# Patient Record
Sex: Female | Born: 1975 | Race: White | Hispanic: No | Marital: Married | Smoking: Never smoker
Health system: Southern US, Community
[De-identification: ages and names within clinical notes are randomized; demographics above are authoritative.]

## PROBLEM LIST (undated history)

## (undated) DIAGNOSIS — M1990 Unspecified osteoarthritis, unspecified site: Secondary | ICD-10-CM

## (undated) DIAGNOSIS — D649 Anemia, unspecified: Secondary | ICD-10-CM

## (undated) DIAGNOSIS — E785 Hyperlipidemia, unspecified: Secondary | ICD-10-CM

## (undated) DIAGNOSIS — K900 Celiac disease: Secondary | ICD-10-CM

## (undated) HISTORY — PX: CHOLECYSTECTOMY: SHX55

## (undated) HISTORY — PX: BREAST ENHANCEMENT SURGERY: SHX7

## (undated) HISTORY — DX: Unspecified osteoarthritis, unspecified site: M19.90

---

## 2006-09-20 HISTORY — PX: REDUCTION MAMMAPLASTY: SUR839

## 2009-06-05 ENCOUNTER — Ambulatory Visit: Payer: Self-pay | Admitting: Unknown Physician Specialty

## 2010-01-17 ENCOUNTER — Emergency Department: Payer: Self-pay | Admitting: Emergency Medicine

## 2011-03-25 ENCOUNTER — Ambulatory Visit: Payer: Self-pay | Admitting: Internal Medicine

## 2011-03-26 ENCOUNTER — Ambulatory Visit: Payer: Self-pay | Admitting: Surgery

## 2011-06-14 ENCOUNTER — Other Ambulatory Visit: Payer: Self-pay | Admitting: Gastroenterology

## 2011-06-21 ENCOUNTER — Ambulatory Visit: Payer: Self-pay | Admitting: Gastroenterology

## 2011-06-24 LAB — PATHOLOGY REPORT

## 2014-01-17 ENCOUNTER — Emergency Department: Payer: Self-pay | Admitting: Emergency Medicine

## 2014-09-20 DIAGNOSIS — K8020 Calculus of gallbladder without cholecystitis without obstruction: Secondary | ICD-10-CM

## 2014-09-20 HISTORY — PX: CHOLECYSTECTOMY: SHX55

## 2014-09-20 HISTORY — DX: Calculus of gallbladder without cholecystitis without obstruction: K80.20

## 2014-10-03 ENCOUNTER — Ambulatory Visit: Payer: Self-pay | Admitting: Obstetrics and Gynecology

## 2014-10-09 ENCOUNTER — Ambulatory Visit: Payer: Self-pay | Admitting: Obstetrics and Gynecology

## 2015-05-09 ENCOUNTER — Other Ambulatory Visit: Payer: Self-pay

## 2015-05-09 ENCOUNTER — Emergency Department: Payer: Managed Care, Other (non HMO)

## 2015-05-09 ENCOUNTER — Emergency Department
Admission: EM | Admit: 2015-05-09 | Discharge: 2015-05-09 | Disposition: A | Discharge status: Home / Self Care | Payer: Managed Care, Other (non HMO) | Attending: Emergency Medicine | Admitting: Emergency Medicine

## 2015-05-09 ENCOUNTER — Encounter: Payer: Self-pay | Admitting: Emergency Medicine

## 2015-05-09 DIAGNOSIS — Z3202 Encounter for pregnancy test, result negative: Secondary | ICD-10-CM | POA: Diagnosis not present

## 2015-05-09 DIAGNOSIS — R079 Chest pain, unspecified: Secondary | ICD-10-CM | POA: Diagnosis not present

## 2015-05-09 DIAGNOSIS — R0602 Shortness of breath: Secondary | ICD-10-CM | POA: Diagnosis present

## 2015-05-09 HISTORY — DX: Hyperlipidemia, unspecified: E78.5

## 2015-05-09 LAB — CBC
HCT: 39.7 % (ref 35.0–47.0)
Hemoglobin: 13.3 g/dL (ref 12.0–16.0)
MCH: 31 pg (ref 26.0–34.0)
MCHC: 33.6 g/dL (ref 32.0–36.0)
MCV: 92.4 fL (ref 80.0–100.0)
Platelets: 272 10*3/uL (ref 150–440)
RBC: 4.3 MIL/uL (ref 3.80–5.20)
RDW: 12.5 % (ref 11.5–14.5)
WBC: 8.8 10*3/uL (ref 3.6–11.0)

## 2015-05-09 LAB — BASIC METABOLIC PANEL
Anion gap: 6 (ref 5–15)
BUN: 13 mg/dL (ref 6–20)
CO2: 25 mmol/L (ref 22–32)
Calcium: 9.5 mg/dL (ref 8.9–10.3)
Chloride: 108 mmol/L (ref 101–111)
Creatinine, Ser: 0.71 mg/dL (ref 0.44–1.00)
GFR calc Af Amer: >60 mL/min (ref >60)
Glucose, Bld: 84 mg/dL (ref 65–99)
Potassium: 4.6 mmol/L (ref 3.5–5.1)
Sodium: 139 mmol/L (ref 135–145)

## 2015-05-09 LAB — TROPONIN I: Troponin I: <0.03 ng/mL (ref <0.031)

## 2015-05-09 LAB — FIBRIN DERIVATIVES D-DIMER (ARMC ONLY): FIBRIN DERIVATIVES D-DIMER (ARMC): 885 — AB (ref 0–499)

## 2015-05-09 LAB — POCT PREGNANCY, URINE: Preg Test, Ur: NEGATIVE

## 2015-05-09 MED ORDER — IOHEXOL 350 MG/ML SOLN
100.0000 mL | Freq: Once | INTRAVENOUS | Status: AC | PRN
  Administered 2015-05-09: 100 mL via INTRAVENOUS

## 2015-05-09 NOTE — ED Provider Notes (Signed)
I seemed care at shift change 3 PM. Patient's troponin repeat was negative. Her d-dimer was elevated. A chest CT to rule out PE was negative for PE or other cause of her shortness of breath and left arm discomfort. I did discuss with her the pulmonary nodule and it seems as she has no risk factors. She will follow up with her primary care physician Dr. Hyacinth Meeker to discuss whether or not she needs repeat CT for the pulmonary nodule.  Governor Rooks, MD 05/09/15 [phone]

## 2015-05-09 NOTE — ED Notes (Signed)
Pt presents with some back pain, arm pain and shortness of breath for one week. Pt is under a lot of stress right now.no signs of acute distress.

## 2015-05-09 NOTE — ED Provider Notes (Signed)
[facility] Emergency Department Provider Note  ____________________________________________  Time seen: Approximately 2:40 PM  I have reviewed the triage vital signs and the nursing notes.   HISTORY  Chief Complaint Shortness of Breath    HPI Amber Mann is a 39 y.o. female no  past medical history other than hyperlipidemia.She reports that she has been feeling under stress after sending her child to school in [place] last week. For about a week she has experienced achiness that is often in her left arm, sometimes goes across her chest and feels tight, and this is also associated with a slight feeling of shortness of breath.  No abdominal pain. Slight nauseated feeling yesterday. No vomiting. No fevers or chills. No cough. The present time she reports that she feels well, but last experienced a feeling of tightness in the chest while on the waiting room.  No ripping, tearing, or moving pain.  Pain described as achy, across the chest. No heavy chest pressure.  Past Medical History  Diagnosis Date  . Hyperlipidemia     There are no active problems to display for this patient.  no family history of early onset coronary disease.  Past Surgical History  Procedure Laterality Date  . Cholecystectomy      Current Outpatient Rx  Name  Route  Sig  Dispense  Refill  . hydrochlorothiazide (HYDRODIURIL) 25 MG tablet   Oral   Take 25 mg by mouth daily as needed (for edema).         Suzzanne Cloud Estradiol (SPRINTEC 28 PO)   Oral   Take 1 tablet by mouth at bedtime.           Allergies Flagyl  No family history on file.  Social History Social History  Substance Use Topics  . Smoking status: Never Smoker   . Smokeless tobacco: None  . Alcohol Use: Yes    Review of Systems Constitutional: No fever/chills Eyes: No visual changes. ENT: No sore throat. Cardiovascular: See history of present illness  Respiratory: See history of  present illness  Gastrointestinal: No abdominal pain.  no vomiting.  No diarrhea.  No constipation. Genitourinary: Negative for dysuria. Musculoskeletal: Negative for back pain. Skin: Negative for rash. Neurological: Negative for headaches, focal weakness or numbness.  She denies pregnancy. Is on birth control as well.  No history of blood clots. Father does have protein C deficiency, but patient reports she was tested and negative. No recent long trips or travel. No recent surgery. No leg pain or swelling. Nonsmoker. Does take oral estrogens.  10-point ROS otherwise negative.  ____________________________________________   PHYSICAL EXAM:  VITAL SIGNS: ED Triage Vitals  Enc Vitals Group     BP 05/09/15 1026 129/72 mmHg     Pulse Rate 05/09/15 1026 87     Resp 05/09/15 1026 18     Temp 05/09/15 1026 98.2 F (36.8 C)     Temp Source 05/09/15 1026 Oral     SpO2 05/09/15 1026 98 %     Weight 05/09/15 1026 178 lb (80.74 kg)     Height 05/09/15 1026 5\' 9"  (1.753 m)     Head Cir --      Peak Flow --      Pain Score 05/09/15 1027 3     Pain Loc --      Pain Edu? --      Excl. in GC? --     Constitutional: Alert and oriented. Well appearing and in no acute  distress. Eyes: Conjunctivae are normal. PERRL. EOMI. Head: Atraumatic. Nose: No congestion/rhinnorhea. Mouth/Throat: Mucous membranes are moist.  Oropharynx non-erythematous. Neck: No stridor.   Cardiovascular: Normal rate, regular rhythm. Grossly normal heart sounds.  Good peripheral circulation. Respiratory: Normal respiratory effort.  No retractions. Lungs CTAB. Gastrointestinal: Soft and nontender. No distention. No abdominal bruits. No CVA tenderness. Musculoskeletal: No lower extremity tenderness nor edema.  No joint effusions. No venous cords. No thigh tenderness. No evidence DVT. Normal dorsalis pedis pulses bilaterally. Neurologic:  Normal speech and language. No gross focal neurologic deficits are appreciated.  Skin:  Skin is warm, dry and intact. No rash noted. Psychiatric: Mood and affect are normal. Speech and behavior are normal.  Patient has a very normal and reassuring physical exam.  ____________________________________________   LABS (all labs ordered are listed, but only abnormal results are displayed)  Labs Reviewed  FIBRIN DERIVATIVES D-DIMER ([facility] ONLY) - Abnormal; Notable for the following:    Fibrin derivatives D-dimer ([facility]) 885 (*)    All other components within normal limits  BASIC METABOLIC PANEL  CBC  TROPONIN I  TROPONIN I  POC URINE PREG, ED   ____________________________________________  EKG  ED ECG REPORT I, QUALE, MARK, the attending physician, personally viewed and interpreted this ECG.  Date: 05/09/2015 EKG Time: 1040 Rate: 75 Rhythm: normal sinus rhythm QRS Axis: normal Intervals: normal ST/T Wave abnormalities: normal Conduction Disutrbances: none Narrative Interpretation: unremarkable  ____________________________________________  RADIOLOGY  Pending ____________________________________________   PROCEDURES  Procedure(s) performed: None  Critical Care performed: No  ____________________________________________   INITIAL IMPRESSION / ASSESSMENT AND PLAN / ED COURSE  Pertinent labs & imaging results that were available during my care of the patient were reviewed by me and considered in my medical decision making (see chart for details).  Patient presents with one week of intermittent chest tightness. Patient really has no risk factor for coronary disease except for history of hyperlipidemia. Her symptoms are atypical and that they've been ongoing for a week, they are nonexertional, no associated diaphoresis or severe chest pressure. Her EKG is completely normal.  First troponin is normal. Because of her history of being on estrogens and, we will screen her for pulmonary embolism via low risk well score criteria d-dimer. No evidence DVT on  exam. Patient has been tested for protein C deficiency and does not have this condition.  Patient's d-dimer returned positive. Order CT angiography r/o PE pending a negative urine pregnancy test. Dr. Shaune Pollack updated.  Diagnosis suspect her chest pain is due to stress, but did discuss with the patient close follow-up care. I reviewed her EKG and labs and clinical history with Dr. Cassie Freer of cardiology. We will have her follow-up on Monday with [facility] clinic. Patient is agreeable with this.  Ongoing care and disposition assigned to Governor Rooks, second troponin and d-dimer pending. If negative, plan is to discharge the patient to follow-up with Dr. Cassie Freer Monday.  Careful chest pain or trouble precautions advised the patient and her mother, both of whom are amicable and agreeable with plan.   ____________________________________________   FINAL CLINICAL IMPRESSION(S) / ED DIAGNOSES  Final diagnoses:  Chest pain with low risk for cardiac etiology  Chest pain      Sharyn Creamer, MD 05/09/15 1600

## 2015-05-09 NOTE — Discharge Instructions (Signed)
You have been seen in the Emergency Department (ED) today for chest pain.  As we have discussed today’s test results are normal, but you may require further testing. ° °Please follow up with the recommended doctor as instructed above in these documents regarding today’s emergent visit and your recent symptoms to discuss further management.  Continue to take your regular medications. If you are not doing so already, please also take a daily baby aspirin (81 mg), at least until you follow up with your doctor. ° °Return to the Emergency Department (ED) if you experience any further chest pain/pressure/tightness, difficulty breathing, or sudden sweating, or other symptoms that concern you. ° ° °Chest Pain Observation °It is often hard to give a specific diagnosis for the cause of chest pain. Among other possibilities your symptoms might be caused by inadequate oxygen delivery to your heart (angina). Angina that is not treated or evaluated can lead to a heart attack (myocardial infarction) or death. °Blood tests, electrocardiograms, and X-rays may have been done to help determine a possible cause of your chest pain. After evaluation and observation, your health care provider has determined that it is unlikely your pain was caused by an unstable condition that requires hospitalization. However, a full evaluation of your pain may need to be completed, with additional diagnostic testing as directed. It is very important to keep your follow-up appointments. Not keeping your follow-up appointments could result in permanent heart damage, disability, or death. If there is any problem keeping your follow-up appointments, you must call your health care provider. °HOME CARE INSTRUCTIONS  °Due to the slight chance that your pain could be angina, it is important to follow your health care provider's treatment plan and also maintain a healthy lifestyle: °· Maintain or work toward achieving a healthy weight. °· Stay physically active  and exercise regularly. °· Decrease your salt intake. °· Eat a balanced, healthy diet. Talk to a dietitian to learn about heart-healthy foods. °· Increase your fiber intake by including whole grains, vegetables, fruits, and nuts in your diet. °· Avoid situations that cause stress, anger, or depression. °· Take medicines as advised by your health care provider. Report any side effects to your health care provider. Do not stop medicines or adjust the dosages on your own. °· Quit smoking. Do not use nicotine patches or gum until you check with your health care provider. °· Keep your blood pressure, blood sugar, and cholesterol levels within normal limits. °· Limit alcohol intake to no more than 1 drink per day for women who are not pregnant and 2 drinks per day for men. °· Do not abuse drugs. °SEEK IMMEDIATE MEDICAL CARE IF: °You have severe chest pain or pressure which may include symptoms such as: °· You feel pain or pressure in your arms, neck, jaw, or back. °· You have severe back or abdominal pain, feel sick to your stomach (nauseous), or throw up (vomit). °· You are sweating profusely. °· You are having a fast or irregular heartbeat. °· You feel short of breath while at rest. °· You notice increasing shortness of breath during rest, sleep, or with activity. °· You have chest pain that does not get better after rest or after taking your usual medicine. °· You wake from sleep with chest pain. °· You are unable to sleep because you cannot breathe. °· You develop a frequent cough or you are coughing up blood. °· You feel dizzy, faint, or experience extreme fatigue. °· You develop severe weakness, dizziness, fainting,   or chills. °Any of these symptoms may represent a serious problem that is an emergency. Do not wait to see if the symptoms will go away. Call your local emergency services (911 in the [place] Do not drive yourself to the hospital. °MAKE SURE YOU: °· Understand these instructions. °· Will watch your  condition. °· Will get help right away if you are not doing well or get worse. °Document Released: 10/09/2010 Document Revised: 09/11/2013 Document Reviewed: 03/08/2013 °ExitCare® Patient Information ©2015 [employer]. This information is not intended to replace advice given to you by your health care provider. Make sure you discuss any questions you have with your health care provider. ° °

## 2015-09-21 HISTORY — PX: REDUCTION MAMMAPLASTY: SUR839

## 2015-10-07 ENCOUNTER — Other Ambulatory Visit: Payer: Self-pay | Admitting: Obstetrics and Gynecology

## 2015-10-07 DIAGNOSIS — N6314 Unspecified lump in the right breast, lower inner quadrant: Secondary | ICD-10-CM

## 2015-10-10 ENCOUNTER — Encounter: Payer: Self-pay | Admitting: *Deleted

## 2015-10-13 ENCOUNTER — Encounter
Admission: RE | Disposition: A | Discharge status: Home / Self Care | Payer: Self-pay | Source: Ambulatory Visit | Attending: Gastroenterology

## 2015-10-13 ENCOUNTER — Encounter: Payer: Self-pay | Admitting: *Deleted

## 2015-10-13 ENCOUNTER — Ambulatory Visit: Payer: Managed Care, Other (non HMO) | Admitting: Anesthesiology

## 2015-10-13 ENCOUNTER — Ambulatory Visit
Admission: RE | Admit: 2015-10-13 | Discharge: 2015-10-13 | Disposition: A | Discharge status: Home / Self Care | Payer: Managed Care, Other (non HMO) | Source: Ambulatory Visit | Attending: Gastroenterology | Admitting: Gastroenterology

## 2015-10-13 DIAGNOSIS — R197 Diarrhea, unspecified: Secondary | ICD-10-CM | POA: Diagnosis present

## 2015-10-13 DIAGNOSIS — Z79899 Other long term (current) drug therapy: Secondary | ICD-10-CM | POA: Insufficient documentation

## 2015-10-13 DIAGNOSIS — K5730 Diverticulosis of large intestine without perforation or abscess without bleeding: Secondary | ICD-10-CM | POA: Insufficient documentation

## 2015-10-13 DIAGNOSIS — E785 Hyperlipidemia, unspecified: Secondary | ICD-10-CM | POA: Diagnosis not present

## 2015-10-13 DIAGNOSIS — Z888 Allergy status to other drugs, medicaments and biological substances status: Secondary | ICD-10-CM | POA: Insufficient documentation

## 2015-10-13 HISTORY — DX: Anemia, unspecified: D64.9

## 2015-10-13 HISTORY — PX: COLONOSCOPY WITH PROPOFOL: SHX5780

## 2015-10-13 LAB — POCT PREGNANCY, URINE: PREG TEST UR: NEGATIVE

## 2015-10-13 SURGERY — COLONOSCOPY WITH PROPOFOL
Anesthesia: General

## 2015-10-13 MED ORDER — SODIUM CHLORIDE 0.9 % IV SOLN
INTRAVENOUS | Status: DC
  Administered 2015-10-13: 11:00:00 via INTRAVENOUS

## 2015-10-13 MED ORDER — PROPOFOL 10 MG/ML IV BOLUS
INTRAVENOUS | Status: DC | PRN
  Administered 2015-10-13: 30 mg via INTRAVENOUS
  Administered 2015-10-13: 40 mg via INTRAVENOUS

## 2015-10-13 MED ORDER — FENTANYL CITRATE (PF) 100 MCG/2ML IJ SOLN
INTRAMUSCULAR | Status: DC | PRN
  Administered 2015-10-13: 50 ug via INTRAVENOUS

## 2015-10-13 MED ORDER — ONDANSETRON HCL 4 MG/2ML IJ SOLN: 4.0000 mg | Freq: Once | INTRAMUSCULAR | Status: DC | PRN

## 2015-10-13 MED ORDER — PHENYLEPHRINE HCL 10 MG/ML IJ SOLN
INTRAMUSCULAR | Status: DC | PRN
  Administered 2015-10-13: 100 ug via INTRAVENOUS

## 2015-10-13 MED ORDER — PROPOFOL 500 MG/50ML IV EMUL
INTRAVENOUS | Status: DC | PRN
  Administered 2015-10-13: 180 ug/kg/min via INTRAVENOUS

## 2015-10-13 MED ORDER — MIDAZOLAM HCL 5 MG/5ML IJ SOLN
INTRAMUSCULAR | Status: DC | PRN
  Administered 2015-10-13: 2 mg via INTRAVENOUS

## 2015-10-13 MED ORDER — FENTANYL CITRATE (PF) 100 MCG/2ML IJ SOLN: 25.0000 ug | INTRAMUSCULAR | Status: DC | PRN

## 2015-10-13 NOTE — Anesthesia Postprocedure Evaluation (Signed)
Anesthesia Post Note  Patient: Amber Mann  Procedure(s) Performed: Procedure(s) (LRB): COLONOSCOPY WITH PROPOFOL (N/A)  Patient location during evaluation: PACU Anesthesia Type: General Level of consciousness: awake and alert and oriented Pain management: pain level controlled Vital Signs Assessment: post-procedure vital signs reviewed and stable Respiratory status: spontaneous breathing Cardiovascular status: blood pressure returned to baseline Anesthetic complications: no    Last Vitals:  Filed Vitals:   10/13/15 1139 10/13/15 1149  BP: 106/74 107/69  Pulse: 73 66  Temp:    Resp: 17 17    Last Pain: There were no vitals filed for this visit.               Kristinia Leavy

## 2015-10-13 NOTE — Anesthesia Preprocedure Evaluation (Signed)
Anesthesia Evaluation  Patient identified by MRN, date of birth, ID band Patient awake    Reviewed: Allergy & Precautions, NPO status , Patient's Chart, lab work & pertinent test results  Airway Mallampati: II  TM Distance: >3 FB     Dental no notable dental hx.    Pulmonary neg pulmonary ROS,    Pulmonary exam normal        Cardiovascular Normal cardiovascular exam     Neuro/Psych negative neurological ROS  negative psych ROS   GI/Hepatic negative GI ROS, Neg liver ROS,   Endo/Other  negative endocrine ROS  Renal/GU negative Renal ROS     Musculoskeletal negative musculoskeletal ROS (+)   Abdominal Normal abdominal exam  (+)   Peds  Hematology negative hematology ROS (+) anemia ,   Anesthesia Other Findings Increased lipids  Reproductive/Obstetrics negative OB ROS                             Anesthesia Physical Anesthesia Plan  ASA: II  Anesthesia Plan: General   Post-op Pain Management:    Induction: Intravenous  Airway Management Planned: Nasal Cannula  Additional Equipment:   Intra-op Plan:   Post-operative Plan:   Informed Consent: I have reviewed the patients History and Physical, chart, labs and discussed the procedure including the risks, benefits and alternatives for the proposed anesthesia with the patient or authorized representative who has indicated his/her understanding and acceptance.   Dental advisory given  Plan Discussed with: CRNA and Surgeon  Anesthesia Plan Comments:         Anesthesia Quick Evaluation

## 2015-10-13 NOTE — H&P (Signed)
Outpatient short stay form Pre-procedure 10/13/2015 10:16 AM Amber Deem MD  Primary Physician: Dr. Bethann Punches  Reason for visit:  Diarrhea  History of present illness:  Patient is a 40 year old female presenting with a problem of some change of bowel habits with diarrhea. She states that she was under a fair amount of stress related to her job once that he discontinued in mid December this seemed to improve. She had stool cultures with ova and parasites were negative C. difficile negative, TSH was normal.    Current facility-administered medications:  .  0.9 %  sodium chloride infusion, , Intravenous, Continuous, Amber Deem, MD .  0.9 %  sodium chloride infusion, , Intravenous, Continuous, Amber Deem, MD  Prescriptions prior to admission  Medication Sig Dispense Refill Last Dose  . colestipol (COLESTID) 1 g tablet Take 2 g by mouth 2 (two) times daily.   10/12/2015 at Unknown time  . ergocalciferol (VITAMIN D2) 50000 units capsule Take 50,000 Units by mouth once a week.   10/12/2015 at Unknown time  . escitalopram (LEXAPRO) 10 MG tablet Take 10 mg by mouth daily.   10/12/2015 at Unknown time  . hydrochlorothiazide (HYDRODIURIL) 25 MG tablet Take 25 mg by mouth daily as needed (for edema).   10/12/2015 at Unknown time  . hyoscyamine (LEVSIN SL) 0.125 MG SL tablet Place 0.125 mg under the tongue every 6 (six) hours as needed.   10/12/2015 at Unknown time  . Norgestimate-Eth Estradiol (SPRINTEC 28 PO) Take 1 tablet by mouth at bedtime.   10/12/2015 at Unknown time     Allergies  Allergen Reactions  . Flagyl [Metronidazole] Other (See Comments)    Reaction:  Nerve pain      Past Medical History  Diagnosis Date  . Hyperlipidemia   . Anemia     Review of systems:      Physical Exam    Heart and lungs: Regular rate and rhythm without rub or gallop, lungs are bilaterally clear.    HEENT: Normocephalic atraumatic eyes are anicteric    Other:     Pertinant exam  for procedure: Soft nontender nondistended bowel sounds positive normoactive    Planned proceedures: Colonoscopy and indicated procedures. I have discussed the risks benefits and complications of procedures to include not limited to bleeding, infection, perforation and the risk of sedation and the patient wishes to proceed.    Amber Deem, MD Gastroenterology 10/13/2015  10:16 AM

## 2015-10-13 NOTE — Transfer of Care (Signed)
Immediate Anesthesia Transfer of Care Note  Patient: Amber Mann  Procedure(s) Performed: Procedure(s): COLONOSCOPY WITH PROPOFOL (N/A)  Patient Location: PACU  Anesthesia Type:General  Level of Consciousness: awake  Airway & Oxygen Therapy: Patient Spontanous Breathing and Patient connected to nasal cannula oxygen  Post-op Assessment: Report given to RN and Post -op Vital signs reviewed and stable  Post vital signs: Reviewed and stable  Last Vitals:  Filed Vitals:   10/13/15 1012 10/13/15 1119  BP: 116/75   Pulse: 87 80  Temp: 37.4 C 37.3 C  Resp: 18 18    Complications: No apparent anesthesia complications

## 2015-10-13 NOTE — Op Note (Signed)
[facility] Gastroenterology Patient Name: Amber Mann Procedure Date: 10/13/2015 10:37 AM MRN: [id number] Account #: [id number] Date of Birth: 1976-05-31 Admit Type: Outpatient Age: 40 Room: [facility] ENDO ROOM 3 Gender: Female Note Status: Finalized Procedure:         Colonoscopy Indications:       Change in bowel habits, Clinically significant diarrhea of                     unexplained origin Providers:         Christena Deem, MD Referring MD:      Danella Penton, MD (Referring MD) Medicines:         Monitored Anesthesia Care Complications:     No immediate complications. Procedure:         Pre-Anesthesia Assessment:                    - ASA Grade Assessment: II - A patient with mild systemic                     disease.                    After obtaining informed consent, the colonoscope was                     passed under direct vision. Throughout the procedure, the                     patient's blood pressure, pulse, and oxygen saturations                     were monitored continuously. The Colonoscope was                     introduced through the anus and advanced to the the cecum,                     identified by appendiceal orifice and ileocecal valve. The                     quality of the bowel preparation was good. Findings:      Multiple small-mouthed diverticula were found in the sigmoid colon and       in the distal descending colon.      The retroflexed view of the distal rectum and anal verge was normal and       showed no anal or rectal abnormalities.      The exam was otherwise without abnormality. The ileocecal valve was       turned open and noted to be normal.      Biopsies for histology were taken with a cold forceps from the right       colon and left colon for evaluation of microscopic colitis.      The digital rectal exam was normal. Impression:        - Diverticulosis in the sigmoid colon and in the distal   descending colon.                    - The distal rectum and anal verge are normal on                     retroflexion view.                    -  The examination was otherwise normal.                    - Biopsies were taken with a cold forceps from the right                     colon and left colon for evaluation of microscopic colitis. Recommendation:    - Discharge patient to home.                    - Return to GI clinic in 5 weeks. Procedure Code(s): --- Professional ---                    [phone], Colonoscopy, flexible; with biopsy, single or                     multiple Diagnosis Code(s): --- Professional ---                    R19.4, Change in bowel habit                    R19.7, Diarrhea, unspecified                    K57.30, Diverticulosis of large intestine without                     perforation or abscess without bleeding CPT copyright 2014 American Medical Association. All rights reserved. The codes documented in this report are preliminary and upon coder review may  be revised to meet current compliance requirements. Christena Deem, MD 10/13/2015 11:11:09 AM This report has been signed electronically. Number of Addenda: 0 Note Initiated On: 10/13/2015 10:37 AM Scope Withdrawal Time: 0 hours 11 minutes 43 seconds  Total Procedure Duration: 0 hours 19 minutes 32 seconds       [facility]

## 2015-10-14 LAB — SURGICAL PATHOLOGY

## 2015-10-15 ENCOUNTER — Ambulatory Visit
Admission: RE | Admit: 2015-10-15 | Discharge: 2015-10-15 | Disposition: A | Discharge status: Home / Self Care | Payer: Managed Care, Other (non HMO) | Source: Ambulatory Visit | Attending: Obstetrics and Gynecology | Admitting: Obstetrics and Gynecology

## 2015-10-15 ENCOUNTER — Other Ambulatory Visit: Payer: Self-pay | Admitting: Obstetrics and Gynecology

## 2015-10-15 ENCOUNTER — Encounter: Payer: Self-pay | Admitting: Gastroenterology

## 2015-10-15 DIAGNOSIS — N6314 Unspecified lump in the right breast, lower inner quadrant: Secondary | ICD-10-CM

## 2015-10-15 DIAGNOSIS — N63 Unspecified lump in breast: Secondary | ICD-10-CM | POA: Diagnosis present

## 2015-10-15 DIAGNOSIS — R928 Other abnormal and inconclusive findings on diagnostic imaging of breast: Secondary | ICD-10-CM | POA: Insufficient documentation

## 2015-11-26 ENCOUNTER — Other Ambulatory Visit: Payer: Self-pay | Admitting: Internal Medicine

## 2015-11-26 DIAGNOSIS — R918 Other nonspecific abnormal finding of lung field: Secondary | ICD-10-CM

## 2016-04-26 ENCOUNTER — Ambulatory Visit: Payer: Managed Care, Other (non HMO)

## 2016-10-20 ENCOUNTER — Other Ambulatory Visit (INDEPENDENT_AMBULATORY_CARE_PROVIDER_SITE_OTHER): Payer: Self-pay | Admitting: Internal Medicine

## 2016-10-20 DIAGNOSIS — M7989 Other specified soft tissue disorders: Secondary | ICD-10-CM

## 2016-10-21 ENCOUNTER — Ambulatory Visit (INDEPENDENT_AMBULATORY_CARE_PROVIDER_SITE_OTHER): Payer: BLUE CROSS/BLUE SHIELD

## 2016-10-21 DIAGNOSIS — M7989 Other specified soft tissue disorders: Secondary | ICD-10-CM

## 2017-05-12 ENCOUNTER — Other Ambulatory Visit: Payer: Self-pay | Admitting: Obstetrics and Gynecology

## 2017-05-12 DIAGNOSIS — Z1231 Encounter for screening mammogram for malignant neoplasm of breast: Secondary | ICD-10-CM

## 2017-05-25 ENCOUNTER — Ambulatory Visit
Admission: RE | Admit: 2017-05-25 | Discharge: 2017-05-25 | Disposition: A | Discharge status: Home / Self Care | Payer: BLUE CROSS/BLUE SHIELD | Source: Ambulatory Visit | Attending: Obstetrics and Gynecology | Admitting: Obstetrics and Gynecology

## 2017-05-25 ENCOUNTER — Encounter (INDEPENDENT_AMBULATORY_CARE_PROVIDER_SITE_OTHER): Payer: Self-pay

## 2017-05-25 DIAGNOSIS — Z1231 Encounter for screening mammogram for malignant neoplasm of breast: Secondary | ICD-10-CM | POA: Diagnosis not present

## 2017-06-20 HISTORY — PX: AUGMENTATION MAMMAPLASTY: SUR837

## 2018-05-16 ENCOUNTER — Other Ambulatory Visit: Payer: Self-pay | Admitting: Obstetrics and Gynecology

## 2018-05-16 DIAGNOSIS — Z1231 Encounter for screening mammogram for malignant neoplasm of breast: Secondary | ICD-10-CM

## 2018-06-15 ENCOUNTER — Ambulatory Visit
Admission: RE | Admit: 2018-06-15 | Discharge: 2018-06-15 | Disposition: A | Discharge status: Home / Self Care | Payer: BLUE CROSS/BLUE SHIELD | Source: Ambulatory Visit | Attending: Obstetrics and Gynecology | Admitting: Obstetrics and Gynecology

## 2018-06-15 ENCOUNTER — Other Ambulatory Visit: Payer: Self-pay | Admitting: Obstetrics and Gynecology

## 2018-06-15 DIAGNOSIS — Z1231 Encounter for screening mammogram for malignant neoplasm of breast: Secondary | ICD-10-CM | POA: Diagnosis not present

## 2019-04-12 ENCOUNTER — Emergency Department
Admission: EM | Admit: 2019-04-12 | Discharge: 2019-04-12 | Discharge status: Absent without leave | Payer: BLUE CROSS/BLUE SHIELD

## 2020-07-15 ENCOUNTER — Other Ambulatory Visit: Payer: Self-pay | Admitting: Obstetrics and Gynecology

## 2020-07-15 DIAGNOSIS — Z1231 Encounter for screening mammogram for malignant neoplasm of breast: Secondary | ICD-10-CM

## 2020-08-07 ENCOUNTER — Encounter (INDEPENDENT_AMBULATORY_CARE_PROVIDER_SITE_OTHER): Payer: Self-pay

## 2020-08-07 ENCOUNTER — Ambulatory Visit
Admission: RE | Admit: 2020-08-07 | Discharge: 2020-08-07 | Disposition: A | Discharge status: Home / Self Care | Payer: 59 | Source: Ambulatory Visit | Attending: Obstetrics and Gynecology | Admitting: Obstetrics and Gynecology

## 2020-08-07 ENCOUNTER — Other Ambulatory Visit: Payer: Self-pay

## 2020-08-07 DIAGNOSIS — Z1231 Encounter for screening mammogram for malignant neoplasm of breast: Secondary | ICD-10-CM | POA: Insufficient documentation

## 2020-09-20 DIAGNOSIS — K900 Celiac disease: Secondary | ICD-10-CM

## 2020-09-20 HISTORY — DX: Celiac disease: K90.0

## 2021-09-20 HISTORY — PX: BREAST IMPLANT REMOVAL: SUR1101

## 2021-10-14 ENCOUNTER — Other Ambulatory Visit: Payer: Self-pay | Admitting: Obstetrics and Gynecology

## 2021-10-14 DIAGNOSIS — Z1231 Encounter for screening mammogram for malignant neoplasm of breast: Secondary | ICD-10-CM

## 2021-11-05 ENCOUNTER — Other Ambulatory Visit (HOSPITAL_BASED_OUTPATIENT_CLINIC_OR_DEPARTMENT_OTHER): Payer: Self-pay

## 2021-11-09 ENCOUNTER — Other Ambulatory Visit (HOSPITAL_BASED_OUTPATIENT_CLINIC_OR_DEPARTMENT_OTHER): Payer: Self-pay

## 2021-11-09 MED ORDER — MOUNJARO 10 MG/0.5ML ~~LOC~~ SOAJ
SUBCUTANEOUS | 1 refills | Status: DC
  Filled 2021-11-09: qty 2, 28d supply, fill #0
  Filled 2022-09-27: qty 2, 28d supply, fill #1

## 2021-11-19 ENCOUNTER — Other Ambulatory Visit: Payer: Self-pay

## 2021-11-19 ENCOUNTER — Ambulatory Visit
Admission: RE | Admit: 2021-11-19 | Discharge: 2021-11-19 | Disposition: A | Discharge status: Home / Self Care | Payer: 59 | Source: Ambulatory Visit | Attending: Obstetrics and Gynecology | Admitting: Obstetrics and Gynecology

## 2021-11-19 DIAGNOSIS — Z1231 Encounter for screening mammogram for malignant neoplasm of breast: Secondary | ICD-10-CM | POA: Insufficient documentation

## 2021-11-20 ENCOUNTER — Other Ambulatory Visit: Payer: Self-pay | Admitting: Obstetrics and Gynecology

## 2021-11-27 ENCOUNTER — Other Ambulatory Visit: Payer: Self-pay | Admitting: Obstetrics and Gynecology

## 2021-11-27 DIAGNOSIS — N63 Unspecified lump in unspecified breast: Secondary | ICD-10-CM

## 2021-11-27 DIAGNOSIS — R928 Other abnormal and inconclusive findings on diagnostic imaging of breast: Secondary | ICD-10-CM

## 2021-12-01 ENCOUNTER — Ambulatory Visit
Admission: RE | Admit: 2021-12-01 | Discharge: 2021-12-01 | Disposition: A | Discharge status: Home / Self Care | Payer: BC Managed Care – PPO | Source: Ambulatory Visit | Attending: Obstetrics and Gynecology | Admitting: Obstetrics and Gynecology

## 2021-12-01 ENCOUNTER — Other Ambulatory Visit: Payer: Self-pay

## 2021-12-01 DIAGNOSIS — N63 Unspecified lump in unspecified breast: Secondary | ICD-10-CM | POA: Insufficient documentation

## 2021-12-01 DIAGNOSIS — R928 Other abnormal and inconclusive findings on diagnostic imaging of breast: Secondary | ICD-10-CM | POA: Insufficient documentation

## 2021-12-03 ENCOUNTER — Other Ambulatory Visit (HOSPITAL_BASED_OUTPATIENT_CLINIC_OR_DEPARTMENT_OTHER): Payer: Self-pay

## 2021-12-03 MED ORDER — TIRZEPATIDE 12.5 MG/0.5ML ~~LOC~~ SOAJ
SUBCUTANEOUS | 0 refills | Status: DC
  Filled 2021-12-03: qty 2, 28d supply, fill #0

## 2021-12-08 ENCOUNTER — Other Ambulatory Visit (HOSPITAL_BASED_OUTPATIENT_CLINIC_OR_DEPARTMENT_OTHER): Payer: Self-pay

## 2022-01-06 ENCOUNTER — Other Ambulatory Visit (HOSPITAL_BASED_OUTPATIENT_CLINIC_OR_DEPARTMENT_OTHER): Payer: Self-pay

## 2022-01-06 MED ORDER — MOUNJARO 12.5 MG/0.5ML ~~LOC~~ SOAJ
SUBCUTANEOUS | 0 refills | Status: DC
  Filled 2022-01-06: qty 2, 28d supply, fill #0

## 2022-01-07 ENCOUNTER — Other Ambulatory Visit (HOSPITAL_BASED_OUTPATIENT_CLINIC_OR_DEPARTMENT_OTHER): Payer: Self-pay

## 2022-01-25 ENCOUNTER — Other Ambulatory Visit (HOSPITAL_BASED_OUTPATIENT_CLINIC_OR_DEPARTMENT_OTHER): Payer: Self-pay

## 2022-01-25 MED ORDER — MOUNJARO 12.5 MG/0.5ML ~~LOC~~ SOAJ
12.5000 mg | SUBCUTANEOUS | 0 refills | Status: DC
  Filled 2022-02-05: qty 2, 28d supply, fill #0

## 2022-01-28 ENCOUNTER — Other Ambulatory Visit (HOSPITAL_BASED_OUTPATIENT_CLINIC_OR_DEPARTMENT_OTHER): Payer: Self-pay

## 2022-02-05 ENCOUNTER — Other Ambulatory Visit (HOSPITAL_BASED_OUTPATIENT_CLINIC_OR_DEPARTMENT_OTHER): Payer: Self-pay

## 2022-03-09 ENCOUNTER — Other Ambulatory Visit (HOSPITAL_BASED_OUTPATIENT_CLINIC_OR_DEPARTMENT_OTHER): Payer: Self-pay

## 2022-03-10 ENCOUNTER — Other Ambulatory Visit (HOSPITAL_BASED_OUTPATIENT_CLINIC_OR_DEPARTMENT_OTHER): Payer: Self-pay

## 2022-03-10 MED ORDER — MOUNJARO 12.5 MG/0.5ML ~~LOC~~ SOAJ
SUBCUTANEOUS | 2 refills | Status: DC
Start: 2022-03-09 — End: 2023-02-02
  Filled 2022-03-10: qty 2, 28d supply, fill #0
  Filled 2022-04-05: qty 2, 28d supply, fill #1
  Filled 2022-04-26: qty 2, 28d supply, fill #2

## 2022-03-11 ENCOUNTER — Other Ambulatory Visit (HOSPITAL_BASED_OUTPATIENT_CLINIC_OR_DEPARTMENT_OTHER): Payer: Self-pay

## 2022-03-15 ENCOUNTER — Other Ambulatory Visit (HOSPITAL_BASED_OUTPATIENT_CLINIC_OR_DEPARTMENT_OTHER): Payer: Self-pay

## 2022-04-05 ENCOUNTER — Other Ambulatory Visit (HOSPITAL_BASED_OUTPATIENT_CLINIC_OR_DEPARTMENT_OTHER): Payer: Self-pay

## 2022-04-12 ENCOUNTER — Other Ambulatory Visit (HOSPITAL_BASED_OUTPATIENT_CLINIC_OR_DEPARTMENT_OTHER): Payer: Self-pay

## 2022-04-26 ENCOUNTER — Other Ambulatory Visit (HOSPITAL_BASED_OUTPATIENT_CLINIC_OR_DEPARTMENT_OTHER): Payer: Self-pay

## 2022-04-28 ENCOUNTER — Other Ambulatory Visit (HOSPITAL_BASED_OUTPATIENT_CLINIC_OR_DEPARTMENT_OTHER): Payer: Self-pay

## 2022-04-28 MED ORDER — MOUNJARO 10 MG/0.5ML ~~LOC~~ SOAJ
SUBCUTANEOUS | 0 refills | Status: DC
  Filled 2022-04-28 – 2022-06-02 (×2): qty 2, 28d supply, fill #0

## 2022-04-29 ENCOUNTER — Other Ambulatory Visit (HOSPITAL_BASED_OUTPATIENT_CLINIC_OR_DEPARTMENT_OTHER): Payer: Self-pay

## 2022-05-20 IMAGING — MG MM DIGITAL DIAGNOSTIC UNILAT*R* IMPLANT W/ TOMO W/ CAD
8 series · 8 of 24 positions shown · non-contrast
Comparison: Prior mammograms dating back to 1854.

CLINICAL DATA: 45-year-old female for further evaluation of
possible RIGHT breast mass on screening mammogram. History of
implants and reductions.

EXAM:
DIGITAL DIAGNOSTIC UNILATERAL RIGHT MAMMOGRAM WITH IMPLANTS, CAD AND
TOMOSYNTHESIS
TECHNIQUE: Right digital diagnostic mammography and breast tomosynthesis was
performed. The images were evaluated with computer-aided detection.
Standard and/or implant displaced views were performed.

[R CC synth-2D (1 of 3)]
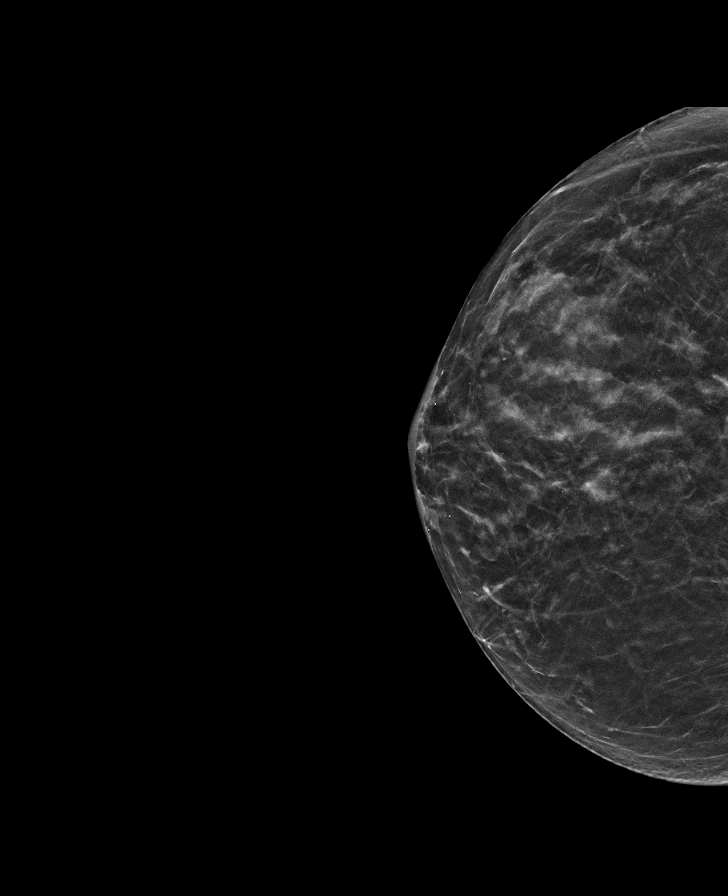

[R CC synth-2D (2 of 3)]
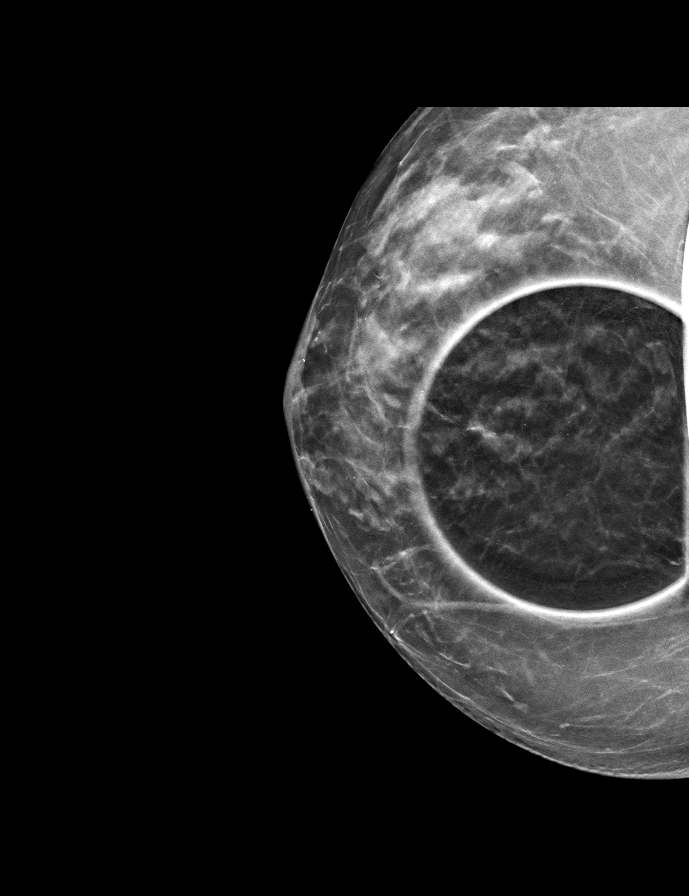

[R ML synth-2D]
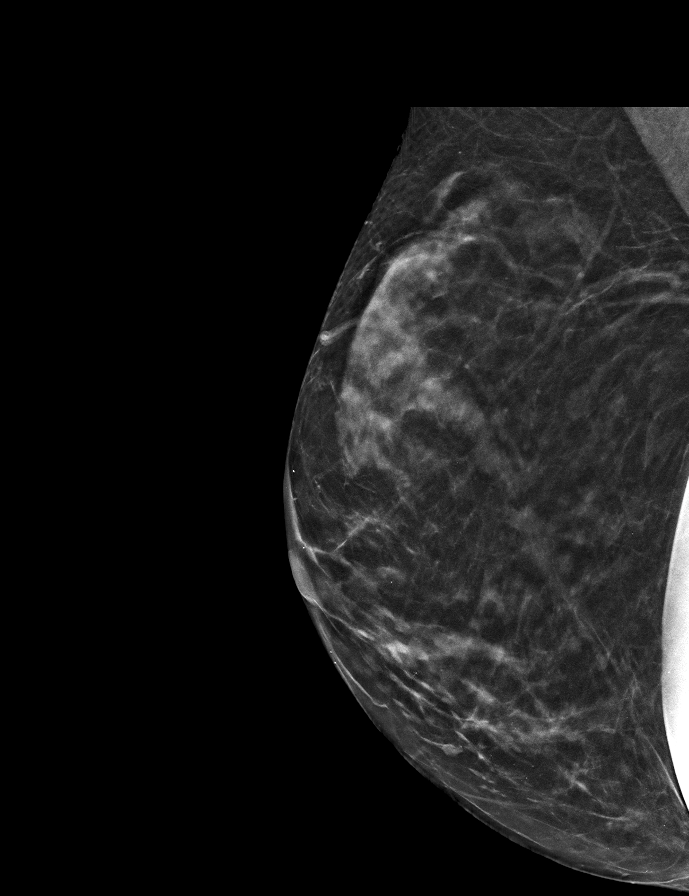

[R CC synth-2D (3 of 3)]
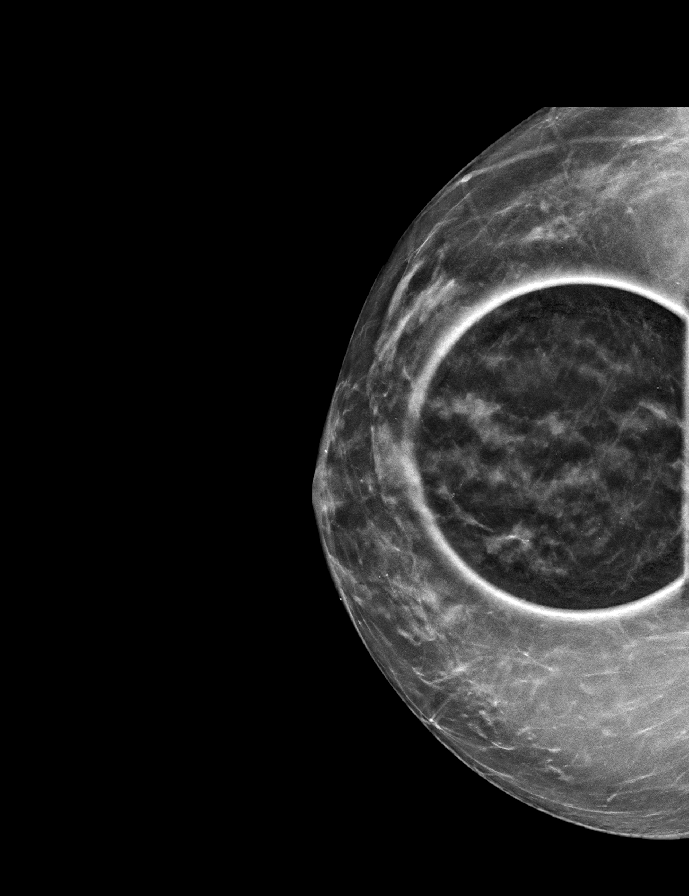

[R MLID BREAST TOMOSYNTHESIS IMAGE tomo · tomo slice 27/53.0]
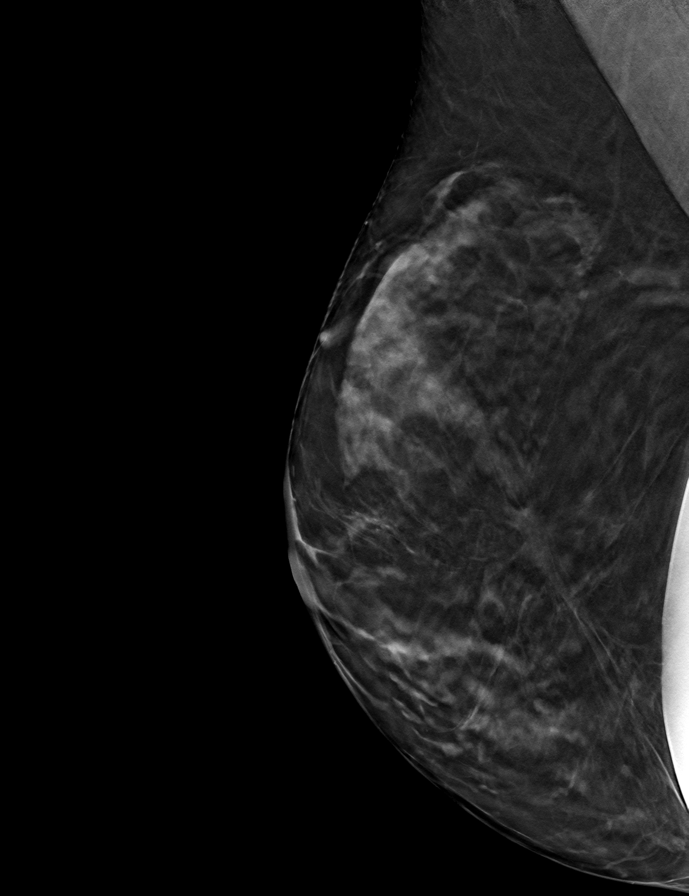

[R CCID BREAST TOMOSYNTHESIS IMAGE tomo (1 of 3) · tomo slice 25/50.0]
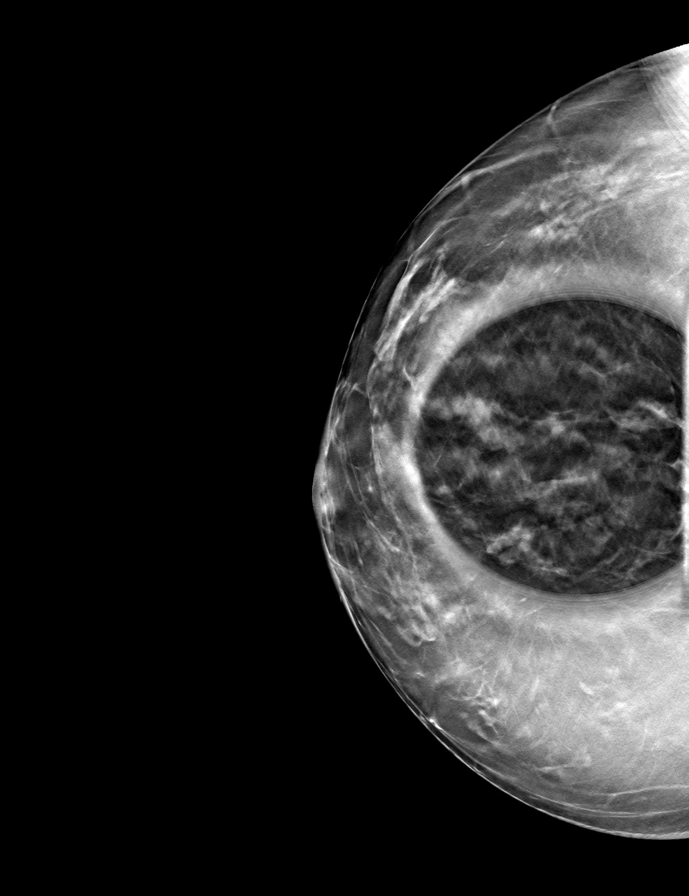

[R CCID BREAST TOMOSYNTHESIS IMAGE tomo (2 of 3) · tomo slice 25/48.0]
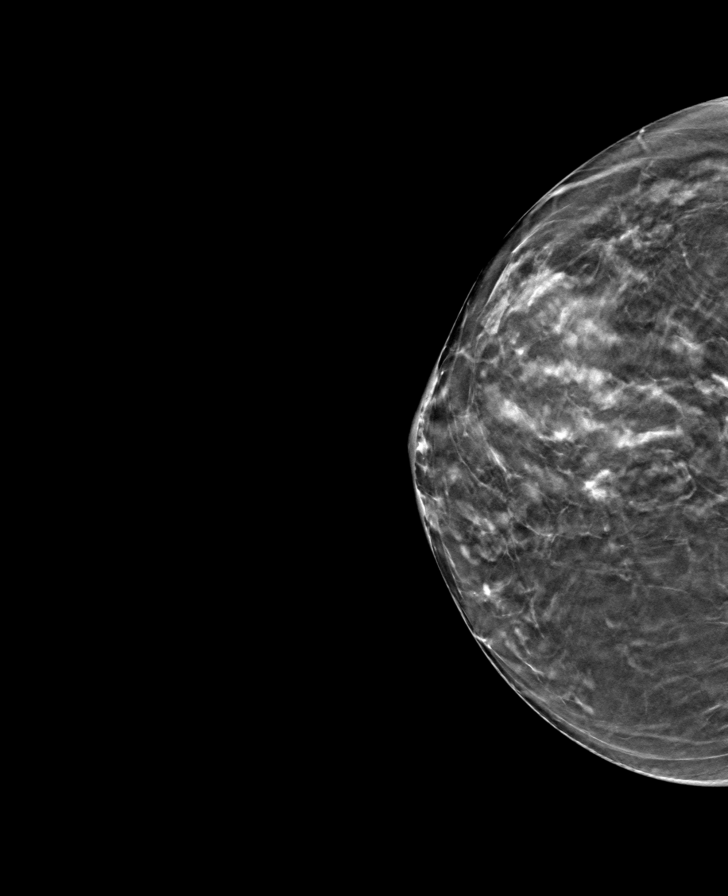

[R CCID BREAST TOMOSYNTHESIS IMAGE tomo (3 of 3) · tomo slice 25/48.0]
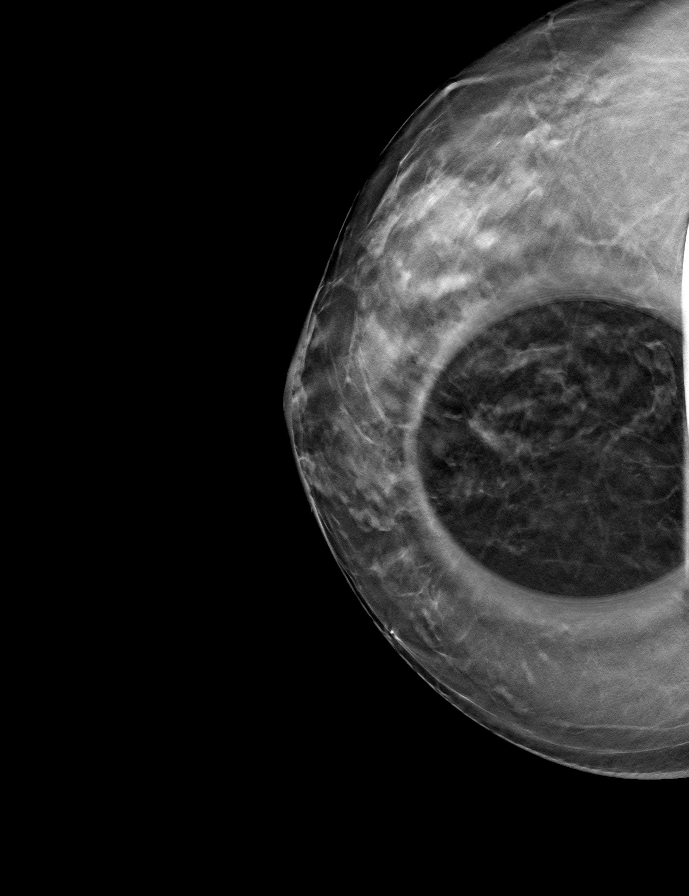

[8 of 24 positions shown; findings below may reference images not displayed]

ACR Breast Density Category b: There are scattered areas of
fibroglandular density.
FINDINGS: 2D/3D full field and spot compression views of the RIGHT breast
demonstrate no persistent suspicious abnormality at the site of the
screening study finding. On today's images, this area has a similar
appearance to remote studies.
IMPRESSION: No persistent suspicious abnormality at the site of the screening
study finding.

RECOMMENDATION:
Bilateral screening mammogram in 1 year.

I have discussed the findings and recommendations with the patient.
If applicable, a reminder letter will be sent to the patient
regarding the next appointment.

BI-RADS CATEGORY  1: Negative.

## 2022-06-02 ENCOUNTER — Other Ambulatory Visit (HOSPITAL_BASED_OUTPATIENT_CLINIC_OR_DEPARTMENT_OTHER): Payer: Self-pay

## 2022-06-13 ENCOUNTER — Emergency Department (HOSPITAL_BASED_OUTPATIENT_CLINIC_OR_DEPARTMENT_OTHER): Payer: BC Managed Care – PPO

## 2022-06-13 ENCOUNTER — Emergency Department (HOSPITAL_BASED_OUTPATIENT_CLINIC_OR_DEPARTMENT_OTHER)
Admission: EM | Admit: 2022-06-13 | Discharge: 2022-06-13 | Disposition: A | Discharge status: Home / Self Care | Payer: BC Managed Care – PPO | Attending: Emergency Medicine | Admitting: Emergency Medicine

## 2022-06-13 ENCOUNTER — Encounter (HOSPITAL_BASED_OUTPATIENT_CLINIC_OR_DEPARTMENT_OTHER): Payer: Self-pay

## 2022-06-13 DIAGNOSIS — M79602 Pain in left arm: Secondary | ICD-10-CM

## 2022-06-13 DIAGNOSIS — R0789 Other chest pain: Secondary | ICD-10-CM | POA: Diagnosis not present

## 2022-06-13 DIAGNOSIS — Z79899 Other long term (current) drug therapy: Secondary | ICD-10-CM | POA: Insufficient documentation

## 2022-06-13 LAB — BASIC METABOLIC PANEL
Anion gap: 7 (ref 5–15)
BUN: 6 mg/dL (ref 6–20)
CO2: 27 mmol/L (ref 22–32)
Calcium: 10 mg/dL (ref 8.9–10.3)
Chloride: 104 mmol/L (ref 98–111)
Creatinine, Ser: 0.72 mg/dL (ref 0.44–1.00)
GFR, Estimated: >60 mL/min (ref >60)
Glucose, Bld: 83 mg/dL (ref 70–99)
Potassium: 4.6 mmol/L (ref 3.5–5.1)
Sodium: 138 mmol/L (ref 135–145)

## 2022-06-13 LAB — CBC
HCT: 40.7 % (ref 36.0–46.0)
Hemoglobin: 13.4 g/dL (ref 12.0–15.0)
MCH: 31.7 pg (ref 26.0–34.0)
MCHC: 32.9 g/dL (ref 30.0–36.0)
MCV: 96.2 fL (ref 80.0–100.0)
Platelets: 203 10*3/uL (ref 150–400)
RBC: 4.23 MIL/uL (ref 3.87–5.11)
RDW: 11.5 % (ref 11.5–15.5)
WBC: 5.2 10*3/uL (ref 4.0–10.5)
nRBC: 0 % (ref 0.0–0.2)

## 2022-06-13 LAB — TROPONIN I (HIGH SENSITIVITY): Troponin I (High Sensitivity): <2 ng/L (ref <18)

## 2022-06-13 NOTE — ED Triage Notes (Signed)
Pt states that last night, her left arm starting aching. Pt states the pain was on her joints. Pt states she has numbness in hand and tingling.

## 2022-06-13 NOTE — ED Provider Notes (Signed)
[facility] EMERGENCY DEPT Provider Note   CSN: [id number] Arrival date & time: 06/13/22  1157     History  Chief Complaint  Patient presents with   Arm Pain    Amber Mann is a 46 y.o. female who presents tot he ED complaining of left arm pain onset yesterday.  Patient notes that she has pain to the left arm with movement of the arm as well as at rest. No injury, trauma, fall, heavy lifting.  Notes that she has associated tingling to her bilateral hands.  Also has associated intermittent sharp chest pain that intermittently goes from the left lower chest wall to the right upper chest wall.  Notes that she rates as a 3/10 and at last for a couple of seconds prior to resolving.  Has a history of hyperlipidemia however has not been on her medications since her breast surgery in June.  Denies history of hypertension, diabetes, MI, stents, echocardiogram.  Has had a stress test in the past.  Denies past medical history of malignancy, DVT/PE, no anticoagulants at this time, recent immobilization/surgery.  Denies family history of MI younger than 17.  She notes that she had the stress test as well as DVT completed in 2017 due to her father having protein see deficiency.   The history is provided by the patient. No language interpreter was used.       Home Medications Prior to Admission medications   Medication Sig Start Date End Date Taking? Authorizing Provider  colestipol (COLESTID) 1 g tablet Take 2 g by mouth 2 (two) times daily.    [provider]  ergocalciferol (VITAMIN D2) 50000 units capsule Take 50,000 Units by mouth once a week.    [provider]  escitalopram (LEXAPRO) 10 MG tablet Take 10 mg by mouth daily.    [provider]  hydrochlorothiazide (HYDRODIURIL) 25 MG tablet Take 25 mg by mouth daily as needed (for edema).    [provider]  hyoscyamine (LEVSIN SL) 0.125 MG SL tablet Place 0.125 mg under the tongue every 6  (six) hours as needed.    [provider]  Norgestimate-Eth Estradiol (SPRINTEC 28 PO) Take 1 tablet by mouth at bedtime.    [provider]  tirzepatide Darcel Bayley) 10 MG/0.5ML Pen Inject 10 mg under the skin on abdomen once weekly. 11/09/21     tirzepatide (MOUNJARO) 10 MG/0.5ML Pen Inject 10mg  under the skin weekly. 04/28/22     tirzepatide (MOUNJARO) 12.5 MG/0.5ML Pen Inject 12.5 mg into the skin once a week. 01/25/22     tirzepatide (MOUNJARO) 12.5 MG/0.5ML Pen Inject 12.5 mg into the skin once weekly. 03/09/22         Allergies    Flagyl [metronidazole]    Review of Systems   Review of Systems  Eyes:  Negative for visual disturbance.  Respiratory:  Negative for shortness of breath.   Cardiovascular:  Positive for chest pain.  Musculoskeletal:  Positive for arthralgias. Negative for joint swelling.  Neurological:  Negative for headaches.  All other systems reviewed and are negative.   Physical Exam Updated Vital Signs BP 105/72 (BP Location: Right Arm)   Pulse 82   Temp 98 F (36.7 C) (Oral)   Resp 16   Ht 5\' 9"  (1.753 m)   Wt 54.4 kg   LMP 05/30/2022   SpO2 100%   BMI 17.72 kg/m  Physical Exam Vitals and nursing note reviewed.  Constitutional:      General: She is  not in acute distress.    Appearance: She is not diaphoretic.  HENT:     Head: Normocephalic and atraumatic.     Mouth/Throat:     Pharynx: No oropharyngeal exudate.  Eyes:     General: No scleral icterus.    Conjunctiva/sclera: Conjunctivae normal.  Cardiovascular:     Rate and Rhythm: Normal rate and regular rhythm.     Pulses: Normal pulses.     Heart sounds: Normal heart sounds.  Pulmonary:     Effort: Pulmonary effort is normal. No respiratory distress.     Breath sounds: Normal breath sounds. No wheezing.  Abdominal:     General: Bowel sounds are normal.     Palpations: Abdomen is soft. There is no mass.     Tenderness: There is no abdominal tenderness. There is no guarding or  rebound.  Musculoskeletal:        General: Normal range of motion.     Cervical back: Normal range of motion and neck supple.     Comments: Mild tenderness to palpation noted to posterior aspect of left upper arm without overlying skin changes.  No obvious swelling noted to the area.  No tenderness to palpation noted to left shoulder, elbow, forearm, wrist, hand.  Capillary refill less than 2 seconds. Radial pulses intact.  Strength sensation intact to bilateral upper and lower extremities.  Grip strength 5/5 bilaterally.  Negative pronator drift.  Able to ambulate without assistance or difficulty.  Skin:    General: Skin is warm and dry.  Neurological:     General: No focal deficit present.     Mental Status: She is alert.     Cranial Nerves: Cranial nerves 2-12 are intact.     Sensory: Sensation is intact.     Motor: Motor function is intact. No pronator drift.  Psychiatric:        Behavior: Behavior normal.     ED Results / Procedures / Treatments   Labs (all labs ordered are listed, but only abnormal results are displayed) Labs Reviewed  BASIC METABOLIC PANEL  CBC  TROPONIN I (HIGH SENSITIVITY)    EKG EKG Interpretation  Date/Time:  Sunday June 13 2022 12:11:15 EDT Ventricular Rate:  81 PR Interval:  122 QRS Duration: 70 QT Interval:  348 QTC Calculation: 404 R Axis:   92 Text Interpretation: Normal sinus rhythm Rightward axis Borderline ECG Since prior ECG, similar TW inversion inferior leads Confirmed by Alvira Monday ([id number]) on 06/13/2022 5:07:06 PM  Radiology DG Chest Port 1 View  Result Date: 06/13/2022 CLINICAL DATA:  Acute chest pain. EXAM: PORTABLE CHEST 1 VIEW COMPARISON:  05/09/2015 chest radiograph FINDINGS: The cardiomediastinal silhouette is unremarkable. There is no evidence of focal airspace disease, pulmonary edema, suspicious pulmonary nodule/mass, pleural effusion, or pneumothorax. No acute bony abnormalities are identified. IMPRESSION: No active  disease. Electronically Signed   By: Harmon Pier M.D.   On: 06/13/2022 15:11    Procedures Procedures    Medications Ordered in ED Medications - No data to display  ED Course/ Medical Decision Making/ A&P Clinical Course as of 06/13/22 1856  Wynelle Link Jun 13, 2022  1730 Discussed with patient lab and imaging in the emergency department today.  Discussed with patient that we will obtain ultrasound of the upper arm to rule out a blood clot.  Patient agreeable at this time. [SB]  1752 Notified by RN and [place] tech that patient needs to leave to pick up her children.  [SB]  Clinical Course User Index [SB] Jayston Trevino A, PA-C                           Medical Decision Making Amount and/or Complexity of Data Reviewed Labs: ordered. Radiology: ordered.   Patient presents to the ED with left arm pain and intermittent, left lateral and right upper chest wall pain onset last night. No prior history of MI, catheterization, DVT/PE. Vital signs patient afebrile, patient not hypoxic or tachycardic. On exam patient with Mild tenderness to palpation noted to posterior aspect of left upper arm without overlying skin changes.  No obvious swelling noted to the area.  No tenderness to palpation noted to left shoulder, elbow, forearm, wrist, hand.  Capillary refill less than 2 seconds.  Strength sensation intact to bilateral upper and lower extremities.  Grip strength 5/5 bilaterally.  Negative pronator drift.  Able to ambulate without assistance or difficulty. Otherwise, no acute cardiovascular, respiratory, abdominal findings. Differential diagnosis includes ACS, aortic dissection, pneumothorax, PE, PNA.    Labs:  I ordered, and personally interpreted labs.  The pertinent results include:   Initial troponin <2 CBC and BMP unremarkable  Imaging: I ordered imaging studies including CXR I independently visualized and interpreted imaging which showed: no acute findings I agree with the radiologist  interpretation  Disposition: Presentation suspicious for left arm pain.  Doubt fracture, dislocation or contusion at this time.  Also suspicious for atypical chest pain. EKG without acute ST/T changes, troponins negative, chest x-ray negative, low suspicion for ACS at this time. Chest x-ray without acute findings, vital signs stable, doubt aortic dissection or pneumothorax at this time.  No risk factors for PE, no unilateral lower extremity swelling, malignancy history, HRT, OCP, recent immobilizations, surgery. Heart score, patient at low risk. After consideration of the diagnostic results and the patients response to treatment, I feel that the patient would benefit from Discharge home. Supportive care measures and strict return precautions discussed with patient at bedside. Pt acknowledges and verbalizes understanding. Pt appears safe for discharge. Follow up as indicated in discharge paperwork.   This chart was dictated using voice recognition software, Dragon. Despite the best efforts of this provider to proofread and correct errors, errors may still occur which can change documentation meaning.   Final Clinical Impression(s) / ED Diagnoses Final diagnoses:  Atypical chest pain  Left arm pain    Rx / DC Orders ED Discharge Orders     None         Azra Abrell A, PA-C 06/13/22 1858    Rondel Baton, MD 06/14/22 1215

## 2022-06-13 NOTE — Discharge Instructions (Addendum)
It was a pleasure taking care of you today!   Your lab and imaging today was unremarkable.  You may follow-up with your primary care provider as needed.  Return to the emergency department for experiencing increasing/worsening symptoms

## 2022-07-13 ENCOUNTER — Other Ambulatory Visit (HOSPITAL_BASED_OUTPATIENT_CLINIC_OR_DEPARTMENT_OTHER): Payer: Self-pay

## 2022-07-13 MED ORDER — MOUNJARO 10 MG/0.5ML ~~LOC~~ SOAJ
SUBCUTANEOUS | 1 refills | Status: DC
  Filled 2022-07-13: qty 2, 28d supply, fill #0
  Filled 2022-08-11: qty 2, 28d supply, fill #1

## 2022-07-16 ENCOUNTER — Other Ambulatory Visit (HOSPITAL_BASED_OUTPATIENT_CLINIC_OR_DEPARTMENT_OTHER): Payer: Self-pay

## 2022-08-11 ENCOUNTER — Other Ambulatory Visit (HOSPITAL_BASED_OUTPATIENT_CLINIC_OR_DEPARTMENT_OTHER): Payer: Self-pay

## 2022-09-27 ENCOUNTER — Other Ambulatory Visit (HOSPITAL_BASED_OUTPATIENT_CLINIC_OR_DEPARTMENT_OTHER): Payer: Self-pay

## 2022-11-05 ENCOUNTER — Other Ambulatory Visit (HOSPITAL_BASED_OUTPATIENT_CLINIC_OR_DEPARTMENT_OTHER): Payer: Self-pay

## 2022-11-07 ENCOUNTER — Encounter (HOSPITAL_BASED_OUTPATIENT_CLINIC_OR_DEPARTMENT_OTHER): Payer: Self-pay | Admitting: Pharmacist

## 2022-11-07 ENCOUNTER — Other Ambulatory Visit (HOSPITAL_BASED_OUTPATIENT_CLINIC_OR_DEPARTMENT_OTHER): Payer: Self-pay

## 2022-11-08 ENCOUNTER — Other Ambulatory Visit (HOSPITAL_BASED_OUTPATIENT_CLINIC_OR_DEPARTMENT_OTHER): Payer: Self-pay

## 2022-11-09 ENCOUNTER — Other Ambulatory Visit (HOSPITAL_BASED_OUTPATIENT_CLINIC_OR_DEPARTMENT_OTHER): Payer: Self-pay

## 2022-11-09 ENCOUNTER — Encounter (HOSPITAL_BASED_OUTPATIENT_CLINIC_OR_DEPARTMENT_OTHER): Payer: Self-pay | Admitting: Pharmacist

## 2022-11-09 MED ORDER — MOUNJARO 10 MG/0.5ML ~~LOC~~ SOAJ
10.0000 mg | SUBCUTANEOUS | 1 refills | Status: DC
  Filled 2022-11-09 – 2022-12-09 (×2): qty 2, 28d supply, fill #0

## 2022-11-11 ENCOUNTER — Other Ambulatory Visit (HOSPITAL_BASED_OUTPATIENT_CLINIC_OR_DEPARTMENT_OTHER): Payer: Self-pay

## 2022-12-02 ENCOUNTER — Other Ambulatory Visit (HOSPITAL_BASED_OUTPATIENT_CLINIC_OR_DEPARTMENT_OTHER): Payer: Self-pay

## 2022-12-09 ENCOUNTER — Other Ambulatory Visit (HOSPITAL_BASED_OUTPATIENT_CLINIC_OR_DEPARTMENT_OTHER): Payer: Self-pay

## 2022-12-22 ENCOUNTER — Other Ambulatory Visit: Payer: Self-pay | Admitting: Obstetrics and Gynecology

## 2022-12-22 DIAGNOSIS — Z1231 Encounter for screening mammogram for malignant neoplasm of breast: Secondary | ICD-10-CM

## 2022-12-27 ENCOUNTER — Ambulatory Visit
Admission: RE | Admit: 2022-12-27 | Discharge: 2022-12-27 | Disposition: A | Discharge status: Home / Self Care | Payer: BC Managed Care – PPO | Source: Ambulatory Visit | Attending: Obstetrics and Gynecology | Admitting: Obstetrics and Gynecology

## 2022-12-27 DIAGNOSIS — Z1231 Encounter for screening mammogram for malignant neoplasm of breast: Secondary | ICD-10-CM | POA: Diagnosis present

## 2022-12-30 ENCOUNTER — Emergency Department (HOSPITAL_BASED_OUTPATIENT_CLINIC_OR_DEPARTMENT_OTHER): Payer: BC Managed Care – PPO

## 2022-12-30 ENCOUNTER — Other Ambulatory Visit: Payer: Self-pay

## 2022-12-30 ENCOUNTER — Other Ambulatory Visit (HOSPITAL_BASED_OUTPATIENT_CLINIC_OR_DEPARTMENT_OTHER): Payer: Self-pay

## 2022-12-30 ENCOUNTER — Emergency Department (HOSPITAL_BASED_OUTPATIENT_CLINIC_OR_DEPARTMENT_OTHER)
Admission: EM | Admit: 2022-12-30 | Discharge: 2022-12-30 | Disposition: A | Discharge status: Home / Self Care | Payer: BC Managed Care – PPO | Attending: Emergency Medicine | Admitting: Emergency Medicine

## 2022-12-30 ENCOUNTER — Encounter (HOSPITAL_BASED_OUTPATIENT_CLINIC_OR_DEPARTMENT_OTHER): Payer: Self-pay | Admitting: Emergency Medicine

## 2022-12-30 DIAGNOSIS — R1310 Dysphagia, unspecified: Secondary | ICD-10-CM | POA: Diagnosis not present

## 2022-12-30 DIAGNOSIS — H938X1 Other specified disorders of right ear: Secondary | ICD-10-CM | POA: Diagnosis not present

## 2022-12-30 DIAGNOSIS — Z79899 Other long term (current) drug therapy: Secondary | ICD-10-CM | POA: Insufficient documentation

## 2022-12-30 DIAGNOSIS — R531 Weakness: Secondary | ICD-10-CM | POA: Insufficient documentation

## 2022-12-30 DIAGNOSIS — R202 Paresthesia of skin: Secondary | ICD-10-CM | POA: Diagnosis present

## 2022-12-30 HISTORY — DX: Celiac disease: K90.0

## 2022-12-30 LAB — CBC WITH DIFFERENTIAL/PLATELET
Abs Immature Granulocytes: 0.01 10*3/uL (ref 0.00–0.07)
Basophils Absolute: 0 10*3/uL (ref 0.0–0.1)
Basophils Relative: 0 %
Eosinophils Absolute: 0.1 10*3/uL (ref 0.0–0.5)
Eosinophils Relative: 1 %
HCT: 39.2 % (ref 36.0–46.0)
Hemoglobin: 13 g/dL (ref 12.0–15.0)
Immature Granulocytes: 0 %
Lymphocytes Relative: 23 %
Lymphs Abs: 1.4 10*3/uL (ref 0.7–4.0)
MCH: 32.1 pg (ref 26.0–34.0)
MCHC: 33.2 g/dL (ref 30.0–36.0)
MCV: 96.8 fL (ref 80.0–100.0)
Monocytes Absolute: 0.3 10*3/uL (ref 0.1–1.0)
Monocytes Relative: 6 %
Neutro Abs: 4.1 10*3/uL (ref 1.7–7.7)
Neutrophils Relative %: 70 %
Platelets: 229 10*3/uL (ref 150–400)
RBC: 4.05 MIL/uL (ref 3.87–5.11)
RDW: 12.2 % (ref 11.5–15.5)
WBC: 5.9 10*3/uL (ref 4.0–10.5)
nRBC: 0 % (ref 0.0–0.2)

## 2022-12-30 LAB — COMPREHENSIVE METABOLIC PANEL
ALT: 36 U/L (ref 0–44)
AST: 24 U/L (ref 15–41)
Albumin: 4.6 g/dL (ref 3.5–5.0)
Alkaline Phosphatase: 38 U/L (ref 38–126)
Anion gap: 9 (ref 5–15)
BUN: 8 mg/dL (ref 6–20)
CO2: 25 mmol/L (ref 22–32)
Calcium: 10 mg/dL (ref 8.9–10.3)
Chloride: 107 mmol/L (ref 98–111)
Creatinine, Ser: 0.66 mg/dL (ref 0.44–1.00)
GFR, Estimated: >60 mL/min (ref >60)
Glucose, Bld: 88 mg/dL (ref 70–99)
Potassium: 4 mmol/L (ref 3.5–5.1)
Sodium: 141 mmol/L (ref 135–145)
Total Bilirubin: 0.8 mg/dL (ref 0.3–1.2)
Total Protein: 7.3 g/dL (ref 6.5–8.1)

## 2022-12-30 LAB — HCG, SERUM, QUALITATIVE: Preg, Serum: NEGATIVE

## 2022-12-30 MED ORDER — IOHEXOL 350 MG/ML SOLN
100.0000 mL | Freq: Once | INTRAVENOUS | Status: AC | PRN
  Administered 2022-12-30: 60 mL via INTRAVENOUS

## 2022-12-30 NOTE — Discharge Instructions (Signed)
You were seen in the emergency department for your numbness and tingling of your face.  Your workup showed no signs of stroke and no obvious tumors or masses in your brain that could be causing your symptoms and your electrolytes were all normal.  Is unclear what is causing your symptoms at this time but you can follow-up with your primary doctor and neurology to have your symptoms rechecked.  You should return to the emergency department if you are having numbness or weakness on one side of the body compared to the other, you are having difficulty speaking, or if you have any other new or concerning symptoms.

## 2022-12-30 NOTE — ED Notes (Signed)
Pt discharged home after verbalizing understanding of discharge instructions; nad noted. 

## 2022-12-30 NOTE — ED Provider Notes (Signed)
[place] EMERGENCY DEPARTMENT AT [facility] Provider Note   CSN: [id number] Arrival date & time: 12/30/22  2725     History  Chief Complaint  Patient presents with   Tinnitus   Facial numbness    Amber Mann is a 47 y.o. female.  Patient is a 47 year old female with a past medical history of celiac disease presenting to the emergency department with facial tingling and difficulty swallowing.  The patient states that last night while eating dinner she felt like she was having difficulty swallowing her food.  She states that she was having tingling behind her right greater than left ear and this morning was having on and off tingling around her mouth.  She states that her lips feel weak.  She states that she felt like she was drooling when she put things in her mouth last night.  She states that she was able to drink normally this morning.  She states that she has been dealing with some TMJ issues and saw a TMJ dentist as well as her chiropractor 2 days ago.  She states that she has had ongoing tinnitus for several years.  She denies any numbness or weakness in her arms or legs, recent nausea, vomiting or diarrhea.  She states that she was seen by her primary doctor last month for similar symptoms and had an negative workup including labs to evaluate for rheumatologic disorders.  The history is provided by the patient.       Home Medications Prior to Admission medications   Medication Sig Start Date End Date Taking? Authorizing Provider  colestipol (COLESTID) 1 g tablet Take 2 g by mouth 2 (two) times daily.    [provider]  ergocalciferol (VITAMIN D2) 50000 units capsule Take 50,000 Units by mouth once a week.    [provider]  escitalopram (LEXAPRO) 10 MG tablet Take 10 mg by mouth daily.    [provider]  hydrochlorothiazide (HYDRODIURIL) 25 MG tablet Take 25 mg by mouth daily as needed (for edema).    [provider]   hyoscyamine (LEVSIN SL) 0.125 MG SL tablet Place 0.125 mg under the tongue every 6 (six) hours as needed.    [provider]  Norgestimate-Eth Estradiol (SPRINTEC 28 PO) Take 1 tablet by mouth at bedtime.    [provider]  rosuvastatin (CRESTOR) 5 MG tablet Take 5 mg by mouth daily.    [provider]  tirzepatide Greggory Keen) 10 MG/0.5ML Pen Inject 10mg  under the skin weekly. 04/28/22     tirzepatide (MOUNJARO) 10 MG/0.5ML Pen Inject 10 milligrams under the skin weekly. 07/13/22     tirzepatide (MOUNJARO) 10 MG/0.5ML Pen Inject 10 mg into the skin once a week. 11/09/22     tirzepatide (MOUNJARO) 12.5 MG/0.5ML Pen Inject 12.5 mg into the skin once a week. 01/25/22     tirzepatide (MOUNJARO) 12.5 MG/0.5ML Pen Inject 12.5 mg into the skin once weekly. 03/09/22         Allergies    Flagyl [metronidazole]    Review of Systems   Review of Systems  Physical Exam Updated Vital Signs BP 103/66 (BP Location: Left Arm)   Pulse 71   Temp 97.9 F (36.6 C) (Oral)   Resp 17   Ht 5\' 9"  (1.753 m)   Wt 53.1 kg   LMP 12/28/2022   SpO2 100%   BMI 17.28 kg/m  Physical Exam Vitals and nursing note reviewed.  Constitutional:      General:  She is not in acute distress.    Appearance: Normal appearance.  HENT:     Head: Normocephalic and atraumatic.     Right Ear: Tympanic membrane, ear canal and external ear normal.     Left Ear: Tympanic membrane, ear canal and external ear normal.     Nose: Nose normal.     Mouth/Throat:     Mouth: Mucous membranes are moist.     Pharynx: Oropharynx is clear.     Comments: Normal palatal elevation Eyes:     Extraocular Movements: Extraocular movements intact.     Conjunctiva/sclera: Conjunctivae normal.     Pupils: Pupils are equal, round, and reactive to light.  Cardiovascular:     Rate and Rhythm: Normal rate and regular rhythm.     Pulses: Normal pulses.     Heart sounds: Normal heart sounds.  Pulmonary:     Effort: Pulmonary  effort is normal.     Breath sounds: Normal breath sounds.  Abdominal:     General: Abdomen is flat.     Palpations: Abdomen is soft.     Tenderness: There is no abdominal tenderness.  Musculoskeletal:        General: Normal range of motion.     Cervical back: Normal range of motion and neck supple.  Lymphadenopathy:     Cervical: No cervical adenopathy.  Skin:    General: Skin is warm and dry.  Neurological:     General: No focal deficit present.     Mental Status: She is alert and oriented to person, place, and time.     Cranial Nerves: No cranial nerve deficit.     Sensory: No sensory deficit.     Motor: No weakness.     Comments: Subjectively decreased sensation around R ear  Psychiatric:        Mood and Affect: Mood normal.        Behavior: Behavior normal.     ED Results / Procedures / Treatments   Labs (all labs ordered are listed, but only abnormal results are displayed) Labs Reviewed  COMPREHENSIVE METABOLIC PANEL  CBC WITH DIFFERENTIAL/PLATELET  HCG, SERUM, QUALITATIVE    EKG None  Radiology CT Angio Head Neck W WO CM  Result Date: 12/30/2022 CLINICAL DATA:  Neuro deficit, acute, stroke suspected EXAM: CT ANGIOGRAPHY HEAD AND NECK WITH AND WITHOUT CONTRAST TECHNIQUE: Multidetector CT imaging of the head and neck was performed using the standard protocol during bolus administration of intravenous contrast. Multiplanar CT image reconstructions and MIPs were obtained to evaluate the vascular anatomy. Carotid stenosis measurements (when applicable) are obtained utilizing NASCET criteria, using the distal internal carotid diameter as the denominator. RADIATION DOSE REDUCTION: This exam was performed according to the departmental dose-optimization program which includes automated exposure control, adjustment of the mA and/or kV according to patient size and/or use of iterative reconstruction technique. CONTRAST:  22mL OMNIPAQUE IOHEXOL 350 MG/ML SOLN COMPARISON:  None  Available. FINDINGS: CT HEAD FINDINGS Brain: No evidence of acute infarction, hemorrhage, hydrocephalus, extra-axial collection or mass lesion/mass effect. Vascular: See below. Skull: No acute fracture. Sinuses/Orbits: Clear sinuses.  No acute findings. Other: No mastoid effusions. Review of the MIP images confirms the above findings CTA NECK FINDINGS Aortic arch: Great vessel origins are patent without significant stenosis. Right carotid system: No evidence of dissection, stenosis (50% or greater), or occlusion. Left carotid system: No evidence of dissection, stenosis (50% or greater), or occlusion. Vertebral arteries: Codominant. No evidence of dissection, stenosis (50% or greater),  or occlusion. Skeleton: No acute findings. Other neck: No acute findings. Upper chest: Visualized lung apices are clear. Review of the MIP images confirms the above findings CTA HEAD FINDINGS Anterior circulation: Bilateral intracranial ICAs, MCAs, and ACAs are patent without proximal hemodynamically significant stenosis. Posterior circulation: Bilateral intradural vertebral arteries, basilar artery and bilateral posterior cerebral arteries are patent without proximal hemodynamically significant stenosis. Venous sinuses: As permitted by contrast timing, patent. Review of the MIP images confirms the above findings IMPRESSION: No large vessel occlusion or proximal hemodynamically significant stenosis. Electronically Signed   By: Feliberto HartsFrederick S Jones M.D.   On: 12/30/2022 10:44    Procedures Procedures    Medications Ordered in ED Medications  iohexol (OMNIPAQUE) 350 MG/ML injection 100 mL (60 mLs Intravenous Contrast Given 12/30/22 1024)    ED Course/ Medical Decision Making/ A&P Clinical Course as of 12/30/22 1141  Thu Dec 30, 2022  1104 CTA without evidence of stroke, vascular occlusion or dissection. Patient is currently without focal neurological deficits and is recommended outpatient neurology follow up. [VK]    Clinical  Course User Index [VK] Rexford MausKingsley, Domenic Schoenberger K, DO                             Medical Decision Making This patient presents to the ED with chief complaint(s) of paresthesias, difficulty swallowing with pertinent past medical history of celiac disease which further complicates the presenting complaint. The complaint involves an extensive differential diagnosis and also carries with it a high risk of complications and morbidity.    The differential diagnosis includes TIA, no focal neurologic deficits making CVA unlikely, vertebral artery dissection, mass effect, electrolyte derangement, TMJ  Additional history obtained: Additional history obtained from N/A Records reviewed Care Everywhere/External Records - recent PCP records and labs  ED Course and Reassessment: On patient's arrival to the emergency department she is well-appearing in no acute distress.  She has no focal neurologic deficits, she is not drooling and has appropriate palatal elevation or exam.  Patient will have swallow screen performed here and will have workup to evaluate for subacute stroke or mass effect causing her neurologic deficits as well as labs to evaluate for electrolyte derangement and she will be closely reassessed.  Independent labs interpretation:  The following labs were independently interpreted: within normal range  Independent visualization of imaging: - I independently visualized the following imaging with scope of interpretation limited to determining acute life threatening conditions related to emergency care: CTA Head/neck, which revealed no acute disease  Consultation: - Consulted or discussed management/test interpretation w/ external professional: N/A  Consideration for admission or further workup: Patient has no emergent conditions requiring admission or further work-up at this time and is stable for discharge home with primary care and neurology follow-up  Social Determinants of health:  N/A    Amount and/or Complexity of Data Reviewed Labs: ordered. Radiology: ordered.  Risk Prescription drug management.          Final Clinical Impression(s) / ED Diagnoses Final diagnoses:  Paresthesias    Rx / DC Orders ED Discharge Orders          Ordered    Ambulatory referral to Neurology       Comments: An appointment is requested in approximately: 4 weeks   12/30/22 1139              Rexford MausKingsley, Satori Krabill K, DO 12/30/22 1141

## 2022-12-30 NOTE — ED Triage Notes (Signed)
Pt arrived POV, from home. Over the last 2 days, noticed some facial numbness and tingling in face and bilateral UE. States she has tinnitus history and had some difficulty swallowing yesterday. Improvement with swallowing today. Rheumatologist appointment in May.

## 2022-12-31 ENCOUNTER — Other Ambulatory Visit: Payer: Self-pay | Admitting: Obstetrics and Gynecology

## 2022-12-31 DIAGNOSIS — R928 Other abnormal and inconclusive findings on diagnostic imaging of breast: Secondary | ICD-10-CM

## 2022-12-31 DIAGNOSIS — R921 Mammographic calcification found on diagnostic imaging of breast: Secondary | ICD-10-CM

## 2023-01-04 ENCOUNTER — Ambulatory Visit
Admission: RE | Admit: 2023-01-04 | Discharge: 2023-01-04 | Disposition: A | Discharge status: Home / Self Care | Payer: BC Managed Care – PPO | Source: Ambulatory Visit | Attending: Obstetrics and Gynecology | Admitting: Obstetrics and Gynecology

## 2023-01-04 ENCOUNTER — Encounter: Payer: Self-pay | Admitting: Neurology

## 2023-01-04 DIAGNOSIS — R928 Other abnormal and inconclusive findings on diagnostic imaging of breast: Secondary | ICD-10-CM | POA: Diagnosis present

## 2023-01-04 DIAGNOSIS — R921 Mammographic calcification found on diagnostic imaging of breast: Secondary | ICD-10-CM | POA: Insufficient documentation

## 2023-01-12 ENCOUNTER — Encounter: Payer: Self-pay | Admitting: Neurology

## 2023-01-12 ENCOUNTER — Ambulatory Visit: Payer: BC Managed Care – PPO | Admitting: Neurology

## 2023-01-12 VITALS — BP 110/66 | HR 79 | Resp 18 | Ht 69.0 in | Wt 116.0 lb

## 2023-01-12 DIAGNOSIS — R531 Weakness: Secondary | ICD-10-CM

## 2023-01-12 DIAGNOSIS — T887XXA Unspecified adverse effect of drug or medicament, initial encounter: Secondary | ICD-10-CM

## 2023-01-12 NOTE — Progress Notes (Signed)
[facility] Neurology Division Clinic Note - Initial Visit   Date: 01/12/2023   Amber Mann MRN: [id number] DOB: 02-Mar-1976   Dear Dr. Audrie Lia:  Thank you for your kind referral of Amber Mann for consultation of generalized weakness and tingling. Although her history is well known to you, please allow [place] to reiterate it for the purpose of our medical record. The patient was accompanied to the clinic by self.    Amber Mann is a 47 y.o. right-handed female with Celiac disease presenting for evaluation of generalized weakness and tingling.   IMPRESSION/PLAN: Dysphagia and weakness likely due to systemic absorption of neurotoxin.  Fortunately, all of her symptoms are improving and she has minimal symptoms now.  Patient was informed that neurotoxin such as botox wear off in about 2-3 months, so side effects will also be temporary.  I am not familiar with the various neurotoxin formulations that she received and appropriate dose for cosmetic purposes, and would suggest seeking a second opinion by dermatology to determine the appropriateness of this.  Patient was reassured that with minimal weakness on exam (right ptosis), normal MRI brain, and improving symptoms, her overall prognosis is very good.   ------------------------------------------------------------- History of present illness: She saw a new skin care provider in [facility] for chemodenervation on 4/4 and received "25U Daxxify to forehead, 20U botox glabella, 40U Dysport Crows feet, 71U Jeuveau (lip, massater, TMJ, dao, neck)".  On 4/7, she began having difficulty swallowing rice when she was at dinner.  She went to the ER the following day and had CT head which was normal.  Following April 8th, she began having generalized pain, weakness, excessive sleepiness, numbness/tingling over her face and hands, and extreme exhaustion.  She had to fly to [facility] in 4/18 with her husband and went to the ER there where MRI  brain was normal.  Since onset, symptoms have significantly improved by 80%.  She has minimal difficulty with swallowing and weakness.  She has a generalized sense of weakness, but is able to do her daily activities.    She is more active now because overall exhaustion has improved.  Pain has improved, now she has rare spells.    Prior to April, she was receiving Dysport by a local provider.  She had 367 units of Dysport on 12/14 and another 72 units on 1/31.  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  She is a nonsmoker and does not drink alcohol.  Out-side paper records, electronic medical record, and images have been reviewed where available and summarized as:  MRI brain wwo contrast OSH 01/07/2023:  Unremarkable contrast-enhanced MRI of the brain    Past Medical History:  Diagnosis Date   Anemia    Celiac disease    Hyperlipidemia     Past Surgical History:  Procedure Laterality Date   AUGMENTATION MAMMAPLASTY Bilateral 06/2017   saline   BREAST ENHANCEMENT SURGERY Bilateral    Explants june 2023   CESAREAN SECTION     CHOLECYSTECTOMY     COLONOSCOPY WITH PROPOFOL N/A 10/13/2015   Procedure: COLONOSCOPY WITH PROPOFOL;  Surgeon: Christena Deem, MD;  Location: [facility] ENDOSCOPY;  Service: Endoscopy;  Laterality: N/A;   REDUCTION MAMMAPLASTY Bilateral 2008   REDUCTION MAMMAPLASTY Bilateral 2017     Medications:  Outpatient Encounter Medications as of 01/12/2023  Medication Sig   colestipol (COLESTID) 1 g tablet Take 2 g by mouth 2 (two) times daily.   ergocalciferol (VITAMIN D2) 50000 units  capsule Take 50,000 Units by mouth once a week.   escitalopram (LEXAPRO) 10 MG tablet Take 10 mg by mouth daily.   hydrochlorothiazide (HYDRODIURIL) 25 MG tablet Take 25 mg by mouth daily as needed (for edema).   hyoscyamine (LEVSIN SL) 0.125 MG SL tablet Place 0.125 mg under the tongue every 6 (six) hours as needed.   Norgestimate-Eth Estradiol (SPRINTEC 28 PO) Take 1 tablet by mouth  at bedtime.   tirzepatide [facility]) 10 MG/0.5ML Pen Inject  under the skin weekly.   rosuvastatin (CRESTOR) 5 MG tablet Take 5 mg by mouth daily. (Patient not taking: Reported on 01/12/2023)   tirzepatide [facility]) 10 MG/0.5ML Pen Inject 10 milligrams under the skin weekly.   tirzepatide (MOUNJARO) 10 MG/0.5ML Pen Inject 10 mg into the skin once a week.   tirzepatide (MOUNJARO) 12.5 MG/0.5ML Pen Inject 12.5 mg into the skin once a week.   tirzepatide (MOUNJARO) 12.5 MG/0.5ML Pen Inject 12.5 mg into the skin once weekly.   No facility-administered encounter medications on file as of 01/12/2023.    Allergies:  Allergies  Allergen Reactions   Flagyl [Metronidazole] Other (See Comments)    Reaction:  Nerve pain     Family History: Family History  Problem Relation Age of Onset   Breast cancer Neg Hx     Social History: Social History   Tobacco Use   Smoking status: Never   Smokeless tobacco: Never  Substance Use Topics   Alcohol use: Never   Drug use: No   Social History   Social History Narrative   Right handed   Drinks plexus   Lives with husband   Self employed   2 children    Vital Signs:  BP 110/66   Pulse 79   Resp 18   Ht  (1.753 m)   Wt 116 lb (52.6 kg)   LMP 12/28/2022   SpO2 99%   BMI 17.13 kg/m    Neurological Exam: MENTAL STATUS including orientation to time, place, person, recent and remote memory, attention span and concentration, language, and fund of knowledge is normal.  Speech is not dysarthric.  CRANIAL NERVES: II:  No visual field defects.     III-IV-VI: Pupils equal round and reactive to light.  Normal conjugate, extra-ocular eye movements in all directions of gaze.  No nystagmus.  Mild right ptosis at rest, no worsening with sustained upgaze.   V:  Normal facial sensation.    VII:  Normal facial symmetry.  Trace facial weakness with buccinator testing 5-/5.  There is visible forehead crease (mild) with frontalis testing,  orbicularis oculi is 5/5.   VIII:  Normal hearing and vestibular function.   IX-X:  Normal palatal movement.   XI:  Normal shoulder shrug and head rotation.   XII:  Normal tongue strength and range of motion, no deviation or fasciculation.  MOTOR:  No atrophy, fasciculations or abnormal movements.  No pronator drift.   Upper Extremity:  Right  Left  Deltoid  5/5   5/5   Biceps  5/5   5/5   Triceps  5/5   5/5   Finger extensors  5/5   5/5   Finger flexors  5/5   5/5   Dorsal interossei  5/5   5/5   Tone (Ashworth scale)  0  0   Lower Extremity:  Right  Left  Hip flexors  5/5   5/5   Dorsiflexors  5/5   5/5   Plantarflexors  5/5  5/5   Toe extensors  5/5   5/5   Toe flexors  5/5   5/5   Tone (Ashworth scale)  0  0   MSRs:                                           Right        Left brachioradialis 2+  2+  biceps 2+  2+  triceps 2+  2+  patellar 2+  2+  ankle jerk 2+  2+  Hoffman no  no  plantar response down  down   SENSORY:  Normal and symmetric perception of light touch, pinprick, vibration, and temperature.    COORDINATION/GAIT: Normal finger-to- nose-finger.  Intact rapid alternating movements bilaterally.  Able to rise from a chair without using arms.  Gait narrow based and stable. Tandem and stressed gait intact.    Thank you for allowing me to participate in patient's care.  If I can answer any additional questions, I would be pleased to do so.    Sincerely,    Marv Alfrey K. Allena Katz, DO

## 2023-01-13 ENCOUNTER — Other Ambulatory Visit (HOSPITAL_BASED_OUTPATIENT_CLINIC_OR_DEPARTMENT_OTHER): Payer: Self-pay

## 2023-01-13 MED ORDER — MOUNJARO 7.5 MG/0.5ML ~~LOC~~ SOAJ
7.5000 mg | SUBCUTANEOUS | 1 refills | Status: DC
  Filled 2023-01-13: qty 2, 28d supply, fill #0

## 2023-01-19 NOTE — Progress Notes (Signed)
Office Visit Note  Patient: Amber Mann             Date of Birth: Nov 03, 1975           MRN: [id number]             PCP: Josefine Class, MD Referring: Elam Dutch, OD Visit Date: 02/02/2023 Occupation: @GUAROCC @  Subjective:  No chief complaint on file.   History of Present Illness: Amber Mann is a 47 y.o. female in consultation per request of optometrist.  According to the patient she had breast reduction surgery in 2008 and followed by another breast reduction surgery in 2017.  She states she had breast implant surgery in 2018 which Ro silicone implants.  She states in March 2021 she started experiencing rash, joint pain and myalgias.  She was seen at [facility] and also at [facility] where she was diagnosed with chronic idiopathic urticaria.  She was treated with prednisone 60 mg p.o. daily for a while followed by antihistamines.  She recalls being on Zyrtec and Pepcid.  She also had several episodes of angioedema.  She recalls that she had joint pain and muscle pain all of her life but her symptoms got worse after she started having hives.  She never developed joint inflammation.  She also had positive ANA during the workup.  She has had frequent diarrhea all her life and was diagnosed with IBS.  In August 2022 she was seen by gastroenterologist at [facility] who diagnosed her with celiac disease.  Patient states after going on gluten-free diet for 2 weeks her joint symptoms improved but GI symptoms persist.  In June 2023 she had bilateral implants removed.  She states that soon after removing the implants her GI symptoms completely resolved and joint symptoms resolved as well.  She has no episodes of diarrhea unless she is gluten.  She has been dealing with dry eyes and seasonal allergies over the years.  She tried to wear contacts 2 months ago which were not comfortable.  She states on December 23, 2022 she had Botox injections by a dermatologist at [facility] after that she started having  increased muscle and nerve pain and muscle twitching.  She was seen by neurologist who thought the reaction was due to Botox.  She recalls having CT scan and MRI which was negative.  Her symptoms are gradually improving.  She continues to have some fatigue, dry eyes, vaginal dryness.  There is no history of oral ulcers, nasal ulcers, malar rash, photosensitivity, Raynaud's or lymphadenopathy.  She still has some myalgias related to recent Botox injections.  She wanted to be evaluated because of previous positive ANA.  She states her recent lab work showed negative ANA.  Patient states that she has been seeing an orthopedic surgeon at [facility] who diagnosed her with osteoarthritis in her knee joints.  She is also seeing a chiropractor who told her that she has arthritis in her cervical spine.  There is family history of systemic lupus in maternal grandmother.  She is gravida 2, para 2 miscarriages 0.  She had no preeclampsia complications during the pregnancy.  No history of DVTs.    Activities of Daily Living:  Patient reports morning stiffness for 0 minutes.   Patient Denies nocturnal pain.  Difficulty dressing/grooming: Denies Difficulty climbing stairs: Denies Difficulty getting out of chair: Denies Difficulty using hands for taps, buttons, cutlery, and/or writing: Denies  Review of Systems  Constitutional:  Positive for fatigue.  HENT:  Negative for mouth sores and mouth dryness.   Eyes:  Positive for dryness.  Respiratory:  Negative for shortness of breath.   Cardiovascular:  Negative for chest pain and palpitations.  Gastrointestinal:  Negative for blood in stool, constipation and diarrhea.  Endocrine: Negative for increased urination.  Genitourinary:  Negative for involuntary urination.  Musculoskeletal:  Positive for myalgias, muscle tenderness and myalgias. Negative for joint pain, gait problem, joint pain, joint swelling, muscle weakness and morning stiffness.  Skin:  Positive for hair  loss. Negative for color change, rash and sensitivity to sunlight.  Allergic/Immunologic: Positive for susceptible to infections.  Neurological:  Negative for dizziness and headaches.  Hematological:  Negative for swollen glands.  Psychiatric/Behavioral:  Negative for depressed mood and sleep disturbance. The patient is nervous/anxious.     PMFS History:  Patient Active Problem List   Diagnosis Date Noted   Dry eye syndrome of bilateral lacrimal glands 02/02/2023   Primary osteoarthritis of both hands 02/02/2023   Primary osteoarthritis of both knees 02/02/2023   Celiac disease 02/02/2023   History of idiopathic urticaria 02/02/2023    Past Medical History:  Diagnosis Date   Anemia    Celiac disease    Hyperlipidemia     Family History  Problem Relation Age of Onset   Other Mother        benign brain tumor   Rheum arthritis Father    Protein C deficiency Father    Dementia Father        vascular   Glaucoma Maternal Uncle    Glaucoma Maternal Grandmother    Lupus Maternal Grandmother    Diabetes Paternal Grandmother    Healthy Daughter    Healthy Son    Breast cancer Neg Hx    Past Surgical History:  Procedure Laterality Date   AUGMENTATION MAMMAPLASTY Bilateral 06/2017   saline   BREAST ENHANCEMENT SURGERY Bilateral    Explants june 2023   CESAREAN SECTION     2001, 2004   CHOLECYSTECTOMY  2016   COLONOSCOPY WITH PROPOFOL N/A 10/13/2015   Procedure: COLONOSCOPY WITH PROPOFOL;  Surgeon: Christena Deem, MD;  Location: [facility] ENDOSCOPY;  Service: Endoscopy;  Laterality: N/A;   REDUCTION MAMMAPLASTY Bilateral 2008   REDUCTION MAMMAPLASTY Bilateral 2017   Social History   Social History Narrative   Right handed   Drinks plexus   Lives with husband   Self employed   2 children   Immunization History  Administered Date(s) Administered   PFIZER(Purple Top)SARS-COV-2 Vaccination 04/17/2020     Objective: Vital Signs: BP 104/74 (BP Location: Left Arm, Patient  Position: Sitting, Cuff Size: Normal)   Pulse 81   Resp 13   Ht 5\' 9"  (1.753 m)   Wt 120 lb 6.4 oz (54.6 kg)   BMI 17.78 kg/m    Physical Exam Vitals and nursing note reviewed.  Constitutional:      Appearance: She is well-developed.  HENT:     Head: Normocephalic and atraumatic.  Eyes:     Conjunctiva/sclera: Conjunctivae normal.  Cardiovascular:     Rate and Rhythm: Normal rate and regular rhythm.     Heart sounds: Normal heart sounds.  Pulmonary:     Effort: Pulmonary effort is normal.     Breath sounds: Normal breath sounds.  Abdominal:     General: Bowel sounds are normal.     Palpations: Abdomen is soft.  Musculoskeletal:     Cervical back: Normal range of motion.  Lymphadenopathy:  Cervical: No cervical adenopathy.  Skin:    General: Skin is warm and dry.     Capillary Refill: Capillary refill takes less than 2 seconds.  Neurological:     Mental Status: She is alert and oriented to person, place, and time.  Psychiatric:        Behavior: Behavior normal.      Musculoskeletal Exam: Cervical, thoracic and lumbar spine were in good range of motion.  She had no SI joint tenderness.  Shoulder joints, elbow joints, wrist joints, MCPs PIPs were in good range of motion.  She had mild thickening of PIP and DIP joints.  No synovitis was noted.  Hip joints and knee joints were in good range of motion.  She had no tenderness over ankles or MTPs.  Bilateral bunions were noted.  CDAI Exam: CDAI Score: -- Patient Global: --; Provider Global: -- Swollen: --; Tender: -- Joint Exam 02/02/2023   No joint exam has been documented for this visit   There is currently no information documented on the homunculus. Go to the Rheumatology activity and complete the homunculus joint exam.  Investigation: No additional findings.  Imaging: MM 3D DIAGNOSTIC MAMMOGRAM UNILATERAL RIGHT BREAST  Result Date: 01/04/2023 CLINICAL DATA:  Patient returns after mammogram for evaluation of  RIGHT breast calcifications. Patient had implants removed in June of 2023. History of prior breast lift x2 prior to the implants. EXAM: DIGITAL DIAGNOSTIC UNILATERAL RIGHT MAMMOGRAM WITH TOMOSYNTHESIS TECHNIQUE: Right digital diagnostic mammography and breast tomosynthesis was performed. COMPARISON:  Previous exam(s). ACR Breast Density Category c: The breasts are heterogeneously dense, which may obscure small masses. FINDINGS: Magnified views are performed of calcifications in the RIGHT breast. These views demonstrate numerous round calcifications in the LOWER central portions of the breast. Scar markers are placed along the anchor scars in the RIGHT breast, demonstrating the calcifications are associated with the scars throughout the LOWER portions of the RIGHT breast. Calcifications have no suspicious morphology or distribution. IMPRESSION: Benign RIGHT breast calcifications. RECOMMENDATION: Screening mammogram in one year.(Code:SM-B-01Y) I have discussed the findings and recommendations with the patient. If applicable, a reminder letter will be sent to the patient regarding the next appointment. BI-RADS CATEGORY  2: Benign. Electronically Signed   By: Norva Pavlov M.D.   On: 01/04/2023 14:46     Right Knee:  No acute fracture or malalignment.  Mild medial compartment joint space narrowing. Mild tricompartmental  osteophytosis. No erosions.  No knee joint effusion. Soft tissues are unremarkable.   Left Knee:  No acute fracture or malalignment.  Moderate lateral and patellofemoral compartment joint space narrowing with  osteophytosis. Medial joint space is relatively preserved. No erosions.  No knee joint effusion. Soft tissues are unremarkable.    Impression:   1. No radiographic evidence of inflammatory arthropathy.  2.  Osteoarthrosis of the knees, as above.     Electronically Reviewed by:  Jettie Pagan, MD, [facility]  Electronically Reviewed on:  10/03/2020 2:37 PM   Recent  Labs: Lab Results  Component Value Date   WBC 5.9 12/30/2022   HGB 13.0 12/30/2022   PLT 229 12/30/2022   NA 141 12/30/2022   K 4.0 12/30/2022   CL 107 12/30/2022   CO2 25 12/30/2022   GLUCOSE 88 12/30/2022   BUN 8 12/30/2022   CREATININE 0.66 12/30/2022   BILITOT 0.8 12/30/2022   ALKPHOS 38 12/30/2022   AST 24 12/30/2022   ALT 36 12/30/2022   PROT 7.3 12/30/2022   ALBUMIN 4.6 12/30/2022  CALCIUM 10.0 12/30/2022   GFRAA >60 05/09/2015   December 14, 2022 ANA negative, double-stranded DNA negative, SSA negative, SSB negative at [employer] Comments: No specialty comments available.  Procedures:  No procedures performed Allergies: Flagyl [metronidazole]   Assessment / Plan:     Visit Diagnoses: Dry eye syndrome of bilateral lacrimal glands - [facility]-Dr. Tyrone Sage.  Patient states she has always had dry eyes.  She tried wearing contact lenses couple of months ago and could not tolerate them.  She denies any history of dry mouth.  She had positive ANA in the past and has family history of lupus and maternal grandmother.  She has recent labs by her PCP on December 14, 2022 which showed ANA negative, double-stranded DNA negative, SSA negative, SSB negative at [place].  There is no history of oral ulcers, nasal ulcers, malar rash, photosensitivity, Raynaud's or lymphadenopathy.  There is no history of inflammatory arthritis.  Based on her symptoms and labs, I cannot establish a diagnosis of Sjogren's at this time.  I did detailed discussion with the patient and advised her to contact me if she develops any new symptoms.  She was advised to follow-up with her ophthalmologist for dry eyes.   Primary osteoarthritis of both hands-she had some early osteoarthritic changes on the clinical examination.  Joint protection was discussed.  Primary osteoarthritis of both knees-patient states she has had chronic knee joint pain since she was in her 30s.  She has had cortisone  injections in the past.  She had x-rays at [facility] which I reviewed.  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  A handout on lower extremity muscle strengthening exercises was given.  Neck pain-patient states she has been experiencing some neck discomfort and has seen a chiropractor who advised her that she has degenerative changes.  A handout on neck exercises was given.  Myalgia-she had recent Botox injection after that she started experiencing neurology's and myalgias.  She was evaluated by neurologist.  She states the symptoms are gradually improving.  She had no muscular weakness or tenderness on the examination today.  Celiac disease-patient was diagnosed with celiac disease in August 2022.  She states she has been on gluten-free diet since then.  She would like to establish with a gastroenterologist here locally.  I will refer her to GI.  History of IBS-patient states she was diagnosed with IBS several years ago.  She is not having any GI symptoms on gluten-free diet.  She states her GI symptoms also resolved after having breast implant removal.  History of removal of both breast implants-patient states that she had to have breast reduction surgery in 2008 and 2017.  In 2018 she had breast implants.  After that she started experiencing urticaria myalgias arthralgias and worsening of her GI symptoms.  She had breast implant removal and June 2023.  After that most of her symptoms resolved.  History of idiopathic urticaria-she started having angioedema and urticaria 2 years after having breast augmentation surgery.  She was on prednisone for a long time and was treated with antihistamines.  She states that her symptoms resolved after the breast implant removal.  Presbyopia  Family history of glaucoma-MGM, Maternal uncle  Family history of systemic lupus erythematosus-MGM  Family history of rheumatoid arthritis-father - He has protein C deficiency.  He had DVTs  and pulmonary embolism in the past.  Orders: Orders Placed This Encounter  Procedures   Ambulatory referral to Gastroenterology  No orders of the defined types were placed in this encounter.    Follow-Up Instructions: Return if symptoms worsen or fail to improve, for Osteoarthritis.   Pollyann Savoy, MD  Note - This record has been created using Animal nutritionist.  Chart creation errors have been sought, but may not always  have been located. Such creation errors do not reflect on  the standard of medical care.

## 2023-01-21 ENCOUNTER — Encounter (HOSPITAL_BASED_OUTPATIENT_CLINIC_OR_DEPARTMENT_OTHER): Payer: Self-pay

## 2023-01-21 ENCOUNTER — Other Ambulatory Visit (HOSPITAL_BASED_OUTPATIENT_CLINIC_OR_DEPARTMENT_OTHER): Payer: Self-pay

## 2023-01-31 ENCOUNTER — Ambulatory Visit: Payer: BC Managed Care – PPO | Admitting: Neurology

## 2023-02-02 ENCOUNTER — Ambulatory Visit: Payer: BC Managed Care – PPO | Attending: Rheumatology | Admitting: Rheumatology

## 2023-02-02 ENCOUNTER — Encounter: Payer: Self-pay | Admitting: Neurology

## 2023-02-02 ENCOUNTER — Encounter: Payer: Self-pay | Admitting: Rheumatology

## 2023-02-02 VITALS — BP 104/74 | HR 81 | Resp 13 | Ht 69.0 in | Wt 120.4 lb

## 2023-02-02 DIAGNOSIS — H524 Presbyopia: Secondary | ICD-10-CM

## 2023-02-02 DIAGNOSIS — Z8269 Family history of other diseases of the musculoskeletal system and connective tissue: Secondary | ICD-10-CM

## 2023-02-02 DIAGNOSIS — M19041 Primary osteoarthritis, right hand: Secondary | ICD-10-CM | POA: Diagnosis not present

## 2023-02-02 DIAGNOSIS — Z9886 Personal history of breast implant removal: Secondary | ICD-10-CM

## 2023-02-02 DIAGNOSIS — M791 Myalgia, unspecified site: Secondary | ICD-10-CM

## 2023-02-02 DIAGNOSIS — M19042 Primary osteoarthritis, left hand: Secondary | ICD-10-CM

## 2023-02-02 DIAGNOSIS — Z872 Personal history of diseases of the skin and subcutaneous tissue: Secondary | ICD-10-CM

## 2023-02-02 DIAGNOSIS — M170 Bilateral primary osteoarthritis of knee: Secondary | ICD-10-CM

## 2023-02-02 DIAGNOSIS — M542 Cervicalgia: Secondary | ICD-10-CM | POA: Diagnosis not present

## 2023-02-02 DIAGNOSIS — K900 Celiac disease: Secondary | ICD-10-CM

## 2023-02-02 DIAGNOSIS — Z8261 Family history of arthritis: Secondary | ICD-10-CM

## 2023-02-02 DIAGNOSIS — H04123 Dry eye syndrome of bilateral lacrimal glands: Secondary | ICD-10-CM | POA: Diagnosis not present

## 2023-02-02 DIAGNOSIS — Z83511 Family history of glaucoma: Secondary | ICD-10-CM

## 2023-02-02 DIAGNOSIS — Z8719 Personal history of other diseases of the digestive system: Secondary | ICD-10-CM

## 2023-02-02 NOTE — Patient Instructions (Signed)
Exercises for Chronic Knee Pain Chronic knee pain is pain that lasts longer than 3 months. For most people with chronic knee pain, exercise and weight loss is an important part of treatment. Your health care provider may want you to focus on: Strengthening the muscles that support your knee. This can take pressure off your knee and lessen pain. Preventing knee stiffness. Maintaining or increasing how far you can move your knee. Losing weight (if this applies) to take pressure off your knee, decrease your risk for injury, and make it easier for you to exercise. Your health care provider will help you develop an exercise program that matches your needs and physical abilities. Below are simple, low-impact exercises you can do at home. Ask your health care provider or a physical therapist how often you should do your exercise program and how many times to repeat each exercise. General safety tips Follow these safety tips for exercising with chronic knee pain: Get your health care provider's approval before doing any exercises. Start slowly and stop any time an exercise causes pain. Do not exercise if your knee pain is flaring up. Warm up first. Stretching a cold muscle can cause an injury. Do 5-10 minutes of easy movement or light stretching before beginning your exercise routine. Do 5-10 minutes of low-impact activity (like walking or cycling) before starting strengthening exercises. Contact your health care provider any time you have pain during or after exercising. Exercise may cause discomfort but should not be painful. It is normal to be a little stiff or sore after exercising.  Stretching and range-of-motion exercises Front thigh stretch  Stand up straight and support your body by holding on to a chair or resting one hand on a wall. With your legs straight and close together, bend one knee to lift your heel up toward your buttocks. Using one hand for support, grab your ankle with your free  hand. Pull your foot up closer toward your buttocks to feel the stretch in front of your thigh. Hold the stretch for 30 seconds. Repeat __________ times. Complete this exercise __________ times a day. Back thigh stretch  Sit on the floor with your back straight and your legs out straight in front of you. Place the palms of your hands on the floor and slide them toward your feet as you bend at the hip. Try to touch your nose to your knees and feel the stretch in the back of your thighs. Hold for 30 seconds. Repeat __________ times. Complete this exercise __________ times a day. Calf stretch  Stand facing a wall. Place the palms of your hands flat against the wall, arms extended, and lean slightly against the wall. Get into a lunge position with one leg bent at the knee and the other leg stretched out straight behind you. Keep both feet facing the wall and increase the bend in your knee while keeping the heel of the other leg flat on the ground. You should feel the stretch in your calf. Hold for 30 seconds. Repeat __________ times. Complete this exercise __________ times a day. Strengthening exercises Straight leg lift Lie on your back with one knee bent and the other leg out straight. Slowly lift the straight leg without bending the knee. Lift until your foot is about 12 inches (30 cm) off the floor. Hold for 3-5 seconds and slowly lower your leg. Repeat __________ times. Complete this exercise __________ times a day. Single leg dip Stand between two chairs and put both hands on the   backs of the chairs for support. Extend one leg out straight with your body weight resting on the heel of the standing leg. Slowly bend your standing knee to dip your body to the level that is comfortable for you. Hold for 3-5 seconds. Repeat __________ times. Complete this exercise __________ times a day. Hamstring curls Stand straight, knees close together, facing the back of a chair. Hold on to the  back of a chair with both hands. Keep one leg straight. Bend the other knee while bringing the heel up toward the buttock until the knee is bent at a 90-degree angle (right angle). Hold for 3-5 seconds. Repeat __________ times. Complete this exercise __________ times a day. Wall squat Stand straight with your back, hips, and head against a wall. Step forward one foot at a time with your back still against the wall. Your feet should be 2 feet (61 cm) from the wall at shoulder width. Keeping your back, hips, and head against the wall, slide down the wall to as close of a sitting position as you can get. Hold for 5-10 seconds, then slowly slide back up. Repeat __________ times. Complete this exercise __________ times a day. Step-ups Step up with one foot onto a sturdy platform or stool that is about 6 inches (15 cm) high. Face sideways with one foot on the platform and one on the ground. Place all your weight on the platform foot and lift your body off the ground until your knee extends. Let your other leg hang free to the side. Hold for 3-5 seconds then slowly lower your weight down to the floor foot. Repeat __________ times. Complete this exercise __________ times a day. Contact a health care provider if: Your exercise causes pain. Your pain is worse after you exercise. Your pain prevents you from doing your exercises. This information is not intended to replace advice given to you by your health care provider. Make sure you discuss any questions you have with your health care provider. Document Revised: 01/10/2020 Document Reviewed: 09/03/2019 Elsevier Patient Education  2023 [employer]  Cervical Strain and Sprain Rehab Ask your health care provider which exercises are safe for you. Do exercises exactly as told by your health care provider and adjust them as directed. It is normal to feel mild stretching, pulling, tightness, or discomfort as you do these exercises. Stop right away if  you feel sudden pain or your pain gets worse. Do not begin these exercises until told by your health care provider. Stretching and range-of-motion exercises Cervical side bending  Using good posture, sit on a stable chair or stand up. Without moving your shoulders, slowly tilt your left / right ear to your shoulder until you feel a stretch in the neck muscles on the opposite side. You should be looking straight ahead. Hold for __________ seconds. Repeat with the other side of your neck. Repeat __________ times. Complete this exercise __________ times a day. Cervical rotation  Using good posture, sit on a stable chair or stand up. Slowly turn your head to the side as if you are looking over your left / right shoulder. Keep your eyes level with the ground. Stop when you feel a stretch along the side and the back of your neck. Hold for __________ seconds. Repeat this by turning to your other side. Repeat __________ times. Complete this exercise __________ times a day. Thoracic extension and pectoral stretch  Roll a towel or a small blanket so it is about 4 inches (10   cm) in diameter. Lie down on your back on a firm surface. Put the towel in the middle of your back across your spine. It should not be under your shoulder blades. Put your hands behind your head and let your elbows fall out to your sides. Hold for __________ seconds. Repeat __________ times. Complete this exercise __________ times a day. Strengthening exercises Upper cervical flexion  Lie on your back with a thin pillow behind your head or a small, rolled-up towel under your neck. Gently tuck your chin toward your chest and nod your head down to look toward your feet. Do not lift your head off the pillow. Hold for __________ seconds. Release the tension slowly. Relax your neck muscles completely before you repeat this exercise. Repeat __________ times. Complete this exercise __________ times a day. Cervical  extension  Stand about 6 inches (15 cm) away from a wall, with your back facing the wall. Place a soft object, about 6-8 inches (15-20 cm) in diameter, between the back of your head and the wall. A soft object could be a small pillow, a ball, or a folded towel. Gently tilt your head back and press into the soft object. Keep your jaw and forehead relaxed. Hold for __________ seconds. Release the tension slowly. Relax your neck muscles completely before you repeat this exercise. Repeat __________ times. Complete this exercise __________ times a day. Posture and body mechanics Body mechanics refer to the movements and positions of your body while you do your daily activities. Posture is part of body mechanics. Good posture and healthy body mechanics can help to relieve stress in your body's tissues and joints. Good posture means that your spine is in its natural S-curve position (your spine is neutral), your shoulders are pulled back slightly, and your head is not tipped forward. The following are general guidelines for using improved posture and body mechanics in your everyday activities. Sitting  When sitting, keep your spine neutral and keep your feet flat on the floor. Use a footrest, if needed, and keep your thighs parallel to the floor. Avoid rounding your shoulders. Avoid tilting your head forward. When working at a desk or a computer, keep your desk at a height where your hands are slightly lower than your elbows. Slide your chair under your desk so you are close enough to maintain good posture. When working at a computer, place your monitor at a height where you are looking straight ahead and you do not have to tilt your head forward or downward to look at the screen. Standing  When standing, keep your spine neutral and keep your feet about hip-width apart. Keep a slight bend in your knees. Your ears, shoulders, and hips should line up. When you do a task in which you stand in one place for  a long time, place one foot up on a stable object that is 2-4 inches (5-10 cm) high, such as a footstool. This helps keep your spine neutral. Resting When lying down and resting, avoid positions that are most painful for you. Try to support your neck in a neutral position. You can use a contour pillow or a small rolled-up towel. Your pillow should support your neck but not push on it. This information is not intended to replace advice given to you by your health care provider. Make sure you discuss any questions you have with your health care provider. Document Revised: 03/29/2022 Document Reviewed: 03/29/2022 Elsevier Patient Education  2023 Elsevier Inc.  

## 2023-02-10 NOTE — Progress Notes (Deleted)
Office Visit Note  Patient: Amber Mann             Date of Birth: 1976/02/01           MRN: [id number]             PCP: Amber Class, MD Referring: Amber Class, MD Visit Date: 02/23/2023 Occupation: @GUAROCC @  Subjective:  No chief complaint on file.   History of Present Illness: Amber Mann is a 47 y.o. female ***     Activities of Daily Living:  Patient reports morning stiffness for *** {minute/hour:19697}.   Patient {ACTIONS;DENIES/REPORTS:21021675::"Denies"} nocturnal pain.  Difficulty dressing/grooming: {ACTIONS;DENIES/REPORTS:21021675::"Denies"} Difficulty climbing stairs: {ACTIONS;DENIES/REPORTS:21021675::"Denies"} Difficulty getting out of chair: {ACTIONS;DENIES/REPORTS:21021675::"Denies"} Difficulty using hands for taps, buttons, cutlery, and/or writing: {ACTIONS;DENIES/REPORTS:21021675::"Denies"}  No Rheumatology ROS completed.   PMFS History:  Patient Active Problem List   Diagnosis Date Noted   Dry eye syndrome of bilateral lacrimal glands 02/02/2023   Primary osteoarthritis of both hands 02/02/2023   Primary osteoarthritis of both knees 02/02/2023   Celiac disease 02/02/2023   History of idiopathic urticaria 02/02/2023    Past Medical History:  Diagnosis Date   Anemia    Celiac disease    Hyperlipidemia     Family History  Problem Relation Age of Onset   Other Mother        benign brain tumor   Rheum arthritis Father    Protein C deficiency Father    Dementia Father        vascular   Glaucoma Maternal Uncle    Glaucoma Maternal Grandmother    Lupus Maternal Grandmother    Diabetes Paternal Grandmother    Healthy Daughter    Healthy Son    Breast cancer Neg Hx    Past Surgical History:  Procedure Laterality Date   AUGMENTATION MAMMAPLASTY Bilateral 06/2017   saline   BREAST ENHANCEMENT SURGERY Bilateral    Explants june 2023   CESAREAN SECTION     2001, 2004   CHOLECYSTECTOMY  2016   COLONOSCOPY WITH PROPOFOL N/A  10/13/2015   Procedure: COLONOSCOPY WITH PROPOFOL;  Surgeon: Christena Deem, MD;  Location: [facility] ENDOSCOPY;  Service: Endoscopy;  Laterality: N/A;   REDUCTION MAMMAPLASTY Bilateral 2008   REDUCTION MAMMAPLASTY Bilateral 2017   Social History   Social History Narrative   Right handed   Drinks plexus   Lives with husband   Self employed   2 children   Immunization History  Administered Date(s) Administered   PFIZER(Purple Top)SARS-COV-2 Vaccination 04/17/2020     Objective: Vital Signs: There were no vitals taken for this visit.   Physical Exam   Musculoskeletal Exam: ***  CDAI Exam: CDAI Score: -- Patient Global: --; Provider Global: -- Swollen: --; Tender: -- Joint Exam 02/23/2023   No joint exam has been documented for this visit   There is currently no information documented on the homunculus. Go to the Rheumatology activity and complete the homunculus joint exam.  Investigation: No additional findings.  Imaging: No results found.  Recent Labs: Lab Results  Component Value Date   WBC 5.9 12/30/2022   HGB 13.0 12/30/2022   PLT 229 12/30/2022   NA 141 12/30/2022   K 4.0 12/30/2022   CL 107 12/30/2022   CO2 25 12/30/2022   GLUCOSE 88 12/30/2022   BUN 8 12/30/2022   CREATININE 0.66 12/30/2022   BILITOT 0.8 12/30/2022   ALKPHOS 38 12/30/2022   AST 24 12/30/2022   ALT 36 12/30/2022   PROT  7.3 12/30/2022   ALBUMIN 4.6 12/30/2022   CALCIUM 10.0 12/30/2022   GFRAA >60 05/09/2015    Speciality Comments: No specialty comments available.  Procedures:  No procedures performed Allergies: Flagyl [metronidazole]   Assessment / Plan:     Visit Diagnoses: No diagnosis found.  Orders: No orders of the defined types were placed in this encounter.  No orders of the defined types were placed in this encounter.   Face-to-face time spent with patient was *** minutes. Greater than 50% of time was spent in counseling and coordination of care.  Follow-Up  Instructions: No follow-ups on file.   Pollyann Savoy, MD  Note - This record has been created using Animal nutritionist.  Chart creation errors have been sought, but may not always  have been located. Such creation errors do not reflect on  the standard of medical care.

## 2023-02-17 ENCOUNTER — Other Ambulatory Visit (HOSPITAL_BASED_OUTPATIENT_CLINIC_OR_DEPARTMENT_OTHER): Payer: Self-pay

## 2023-02-17 MED ORDER — HYDROXYZINE HCL 25 MG PO TABS
25.0000 mg | ORAL_TABLET | Freq: Every day | ORAL | 0 refills | Status: DC | PRN
  Filled 2023-02-17: qty 90, 90d supply, fill #0

## 2023-02-23 ENCOUNTER — Other Ambulatory Visit (HOSPITAL_COMMUNITY)
Admission: RE | Admit: 2023-02-23 | Discharge: 2023-02-23 | Disposition: A | Discharge status: Home / Self Care | Payer: BC Managed Care – PPO | Source: Ambulatory Visit | Attending: Family Medicine | Admitting: Family Medicine

## 2023-02-23 ENCOUNTER — Ambulatory Visit (INDEPENDENT_AMBULATORY_CARE_PROVIDER_SITE_OTHER): Payer: BC Managed Care – PPO | Admitting: Family Medicine

## 2023-02-23 ENCOUNTER — Ambulatory Visit: Payer: BC Managed Care – PPO | Admitting: Rheumatology

## 2023-02-23 ENCOUNTER — Encounter: Payer: Self-pay | Admitting: Family Medicine

## 2023-02-23 VITALS — BP 112/74 | HR 60 | Ht 69.0 in | Wt 120.0 lb

## 2023-02-23 DIAGNOSIS — N951 Menopausal and female climacteric states: Secondary | ICD-10-CM | POA: Diagnosis not present

## 2023-02-23 DIAGNOSIS — Z01419 Encounter for gynecological examination (general) (routine) without abnormal findings: Secondary | ICD-10-CM

## 2023-02-23 NOTE — Assessment & Plan Note (Signed)
Discussed usual indications for HRT, some risks, etc, timing of starting that.

## 2023-02-23 NOTE — Progress Notes (Signed)
Patient presents for Annual.  LMP: 02/18/2023 Last pap: 12/17/2021 WNL  Contraception: None Mammogram: Up to date: 01/04/23 No Family Hx of Breast Cancer. STD Screening: Declines Flu Vaccine : N/A  Pt would like to discuss hormones , notices decreased libido, and vaginal dryness.  Pt had Breast implants removed 2023 had numerous medical issues with them has been doing better ever since.  Fun Fact: Patient has a dog named Alesia Banda who is 85 lbs.

## 2023-02-23 NOTE — Progress Notes (Signed)
Subjective:     Amber Mann is a 47 y.o. female and is here for a comprehensive physical exam. The patient reports problems - vaginal dryness and poor libido. Reports cycles are mostly regular still. This last one came 1 week early. Had some botox recently and some side effects from this.  The following portions of the patient's history were reviewed and updated as appropriate: allergies, current medications, past family history, past medical history, past social history, past surgical history, and problem list.  Review of Systems Pertinent items noted in HPI and remainder of comprehensive ROS otherwise negative.   Objective:  Chaperone present for exam   BP 112/74   Pulse 60   Ht 5\' 9"  (1.753 m)   Wt 120 lb (54.4 kg)   LMP 02/18/2023 (Exact Date)   BMI 17.72 kg/m  General appearance: alert, cooperative, and appears stated age Head: Normocephalic, without obvious abnormality, atraumatic Neck: no adenopathy, supple, symmetrical, trachea midline, and thyroid not enlarged, symmetric, no tenderness/mass/nodules Lungs: clear to auscultation bilaterally Breasts: normal appearance, no masses or tenderness Heart: regular rate and rhythm Abdomen: soft, non-tender; bowel sounds normal; no masses,  no organomegaly Pelvic: cervix normal in appearance, external genitalia normal, no adnexal masses or tenderness, no cervical motion tenderness, uterus normal size, shape, and consistency, and vagina normal without discharge Extremities: extremities normal, atraumatic, no cyanosis or edema Pulses: 2+ and symmetric Skin: Skin color, texture, turgor normal. No rashes or lesions Lymph nodes: Cervical, supraclavicular, and axillary nodes normal. Neurologic: Grossly normal    Assessment:    Healthy female exam.      Plan:   Problem List Items Addressed This Visit       Unprioritized   Perimenopause    Discussed usual indications for HRT, some risks, etc, timing of starting that.       Other Visit Diagnoses     Encounter for gynecological examination without abnormal finding    -  Primary   pap today - mammogram in 4/24   Relevant Orders   Cytology - PAP      Return in 1 year (on 02/23/2024).    See After Visit Summary for Counseling Recommendations

## 2023-03-01 ENCOUNTER — Ambulatory Visit: Payer: BC Managed Care – PPO | Admitting: Neurology

## 2023-03-01 LAB — CYTOLOGY - PAP
Adequacy: ABSENT
Comment: NEGATIVE
Diagnosis: NEGATIVE
High risk HPV: NEGATIVE

## 2023-03-11 ENCOUNTER — Telehealth: Payer: Self-pay | Admitting: Internal Medicine

## 2023-03-11 NOTE — Telephone Encounter (Signed)
Good afternoon Dr Rhea Belton  The following patient was referred to [place] for  Celiac Disease. She was with [facility] but is looking to come to [place] because she's new to this area and needs to establish care. Her rheumatologist suggested you to be her provider. Records are available with Care Everywhere and Epic. Please review and advise of scheduling. Thank you.

## 2023-03-14 NOTE — Telephone Encounter (Signed)
Ok with me 

## 2023-03-15 ENCOUNTER — Encounter: Payer: Self-pay | Admitting: Internal Medicine

## 2023-03-28 ENCOUNTER — Encounter (HOSPITAL_BASED_OUTPATIENT_CLINIC_OR_DEPARTMENT_OTHER): Payer: Self-pay

## 2023-03-28 ENCOUNTER — Other Ambulatory Visit (HOSPITAL_BASED_OUTPATIENT_CLINIC_OR_DEPARTMENT_OTHER): Payer: Self-pay

## 2023-03-28 MED ORDER — MOUNJARO 7.5 MG/0.5ML ~~LOC~~ SOAJ
7.5000 mg | SUBCUTANEOUS | 2 refills | Status: DC
  Filled 2023-03-28: qty 2, 28d supply, fill #0
  Filled 2023-11-12: qty 2, 28d supply, fill #1

## 2023-03-31 ENCOUNTER — Other Ambulatory Visit (HOSPITAL_BASED_OUTPATIENT_CLINIC_OR_DEPARTMENT_OTHER): Payer: Self-pay

## 2023-04-15 ENCOUNTER — Other Ambulatory Visit (HOSPITAL_BASED_OUTPATIENT_CLINIC_OR_DEPARTMENT_OTHER): Payer: Self-pay

## 2023-04-15 MED ORDER — MOUNJARO 10 MG/0.5ML ~~LOC~~ SOAJ
10.0000 mg | SUBCUTANEOUS | 1 refills | Status: DC
  Filled 2023-04-15: qty 2, 28d supply, fill #0
  Filled 2023-05-18: qty 2, 28d supply, fill #1

## 2023-05-18 ENCOUNTER — Other Ambulatory Visit (HOSPITAL_BASED_OUTPATIENT_CLINIC_OR_DEPARTMENT_OTHER): Payer: Self-pay

## 2023-06-16 ENCOUNTER — Other Ambulatory Visit (HOSPITAL_BASED_OUTPATIENT_CLINIC_OR_DEPARTMENT_OTHER): Payer: Self-pay

## 2023-07-08 ENCOUNTER — Other Ambulatory Visit (HOSPITAL_BASED_OUTPATIENT_CLINIC_OR_DEPARTMENT_OTHER): Payer: Self-pay

## 2023-07-09 ENCOUNTER — Other Ambulatory Visit (HOSPITAL_BASED_OUTPATIENT_CLINIC_OR_DEPARTMENT_OTHER): Payer: Self-pay

## 2023-07-09 MED ORDER — MOUNJARO 10 MG/0.5ML ~~LOC~~ SOAJ
10.0000 mg | SUBCUTANEOUS | 2 refills | Status: DC
  Filled 2023-07-09: qty 2, 28d supply, fill #0
  Filled 2023-08-15: qty 2, 28d supply, fill #1
  Filled 2023-09-19: qty 2, 28d supply, fill #2

## 2023-07-26 ENCOUNTER — Encounter: Payer: Self-pay | Admitting: Internal Medicine

## 2023-07-26 ENCOUNTER — Other Ambulatory Visit (INDEPENDENT_AMBULATORY_CARE_PROVIDER_SITE_OTHER): Payer: BC Managed Care – PPO

## 2023-07-26 ENCOUNTER — Ambulatory Visit: Payer: BC Managed Care – PPO | Admitting: Internal Medicine

## 2023-07-26 VITALS — BP 94/62 | HR 58 | Ht 69.0 in | Wt 122.0 lb

## 2023-07-26 DIAGNOSIS — K900 Celiac disease: Secondary | ICD-10-CM

## 2023-07-26 NOTE — Patient Instructions (Addendum)
Your provider has requested that you go to the basement level for lab work before leaving today. Press "B" on the elevator. The lab is located at the first door on the left as you exit the elevator.  _______________________________________________________  If your blood pressure at your visit was 140/90 or greater, please contact your primary care physician to follow up on this.  _______________________________________________________  If you are age 49 or older, your body mass index should be between 23-30. Your Body mass index is 18.02 kg/m. If this is out of the aforementioned range listed, please consider follow up with your Primary Care Provider.  If you are age 32 or younger, your body mass index should be between 19-25. Your Body mass index is 18.02 kg/m. If this is out of the aformentioned range listed, please consider follow up with your Primary Care Provider.   ________________________________________________________  The [facility] providers would like to encourage you to use [facility] to communicate with providers for non-urgent requests or questions.  Due to long hold times on the telephone, sending your provider a message by [facility] may be a faster and more efficient way to get a response.  Please allow 48 business hours for a response.  Please remember that this is for non-urgent requests.  _______________________________________________________  Due to recent changes in healthcare laws, you may see the results of your imaging and laboratory studies on MyChart before your provider has had a chance to review them.  We understand that in some cases there may be results that are confusing or concerning to you. Not all laboratory results come back in the same time frame and the provider may be waiting for multiple results in order to interpret others.  Please give [place] 48 hours in order for your provider to thoroughly review all the results before contacting the office for clarification of  your results.   Thank you for choosing me and [facility].  Dr.Jay Pyrtle

## 2023-07-26 NOTE — Progress Notes (Signed)
Patient ID: Amber Mann, female   DOB: 1976/01/13, 47 y.o.   MRN: [id number] HPI: Amber Mann is a 47 year old female with a history of celiac disease, hyperlipidemia, breast implant illness status post breast implant removal who is seen in consult at the request of Dr. Audrie Lia to establish care for her celiac disease.  She is here alone today.  Her celiac disease was diagnosed at [facility] by elevated TTG and confirmed by small bowel biopsies.  She has been strictly gluten-free since diagnosis.  Her GI symptoms have resolved.  Prior to diagnosis she was having very frequent diarrhea and abdominal bloating and fatigue.  In the event that she has an accidental gluten exposure she will have several days of diarrhea but about 10 days of severe fatigue.  She does take a daily multivitamin.  She has a history of prior breast reduction surgery and following this had breast implants placed.  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  This required high-dose steroid and she subsequently suffered cushingoid steroid effect.  This necessitated diuretics as well as starting Mounjaro.  She now takes chronic weekly low-dose Mounjaro.  Past Medical History:  Diagnosis Date   Anemia    Arthritis    Celiac disease 2022   Gallstones 2016   Hyperlipidemia    Hyperlipidemia     Past Surgical History:  Procedure Laterality Date   AUGMENTATION MAMMAPLASTY Bilateral 06/2017   saline   BREAST ENHANCEMENT SURGERY Bilateral    Explants june 2023   BREAST IMPLANT REMOVAL  2023   [facility] plastic surgery   CESAREAN SECTION     2001, 2004   CHOLECYSTECTOMY  2016   COLONOSCOPY WITH PROPOFOL N/A 10/13/2015   Procedure: COLONOSCOPY WITH PROPOFOL;  Surgeon: Christena Deem, MD;  Location: [facility] ENDOSCOPY;  Service: Endoscopy;  Laterality: N/A;   REDUCTION MAMMAPLASTY Bilateral 2008   REDUCTION MAMMAPLASTY  Bilateral 2017    Outpatient Medications Prior to Visit  Medication Sig Dispense Refill   Acetaminophen (TYLENOL PO) Take by mouth as needed.     Cholecalciferol (VITAMIN D-3) 125 MCG (5000 UT) TABS Take 1 tablet by mouth daily.     Multiple Vitamin (MULTIVITAMIN) tablet Take 1 tablet by mouth daily.     rosuvastatin (CRESTOR) 5 MG tablet Take 5 mg by mouth daily.     tirzepatide (MOUNJARO) 10 MG/0.5ML Pen Inject 10 mg into the skin once a week. 2 mL 2   tirzepatide (MOUNJARO) 7.5 MG/0.5ML Pen Inject 7.5 mg into the skin once a week. 2 mL 1   tirzepatide (MOUNJARO) 7.5 MG/0.5ML Pen Inject 7.5 mg into the skin once a week. 2 mL 2   ergocalciferol (VITAMIN D2) 50000 units capsule Take 50,000 Units by mouth once a week. (Patient not taking: Reported on 07/26/2023)     hydrOXYzine (ATARAX) 25 MG tablet Take 1 tablet (25 mg total) by mouth daily as needed for anxiety. (Patient not taking: Reported on 07/26/2023) 90 tablet 0   hydrOXYzine (ATARAX) 25 MG tablet Take 25 mg by mouth as needed.     No facility-administered medications prior to visit.    Allergies  Allergen Reactions   Flagyl [Metronidazole] Other (See Comments)    Reaction:  Nerve pain     Family History  Problem Relation Age of Onset   Other Mother        benign brain tumor   Rheum  arthritis Father    Protein C deficiency Father    Dementia Father        vascular   Colon polyps Father    Hypertension Father    Glaucoma Maternal Grandmother    Lupus Maternal Grandmother    Cancer Maternal Grandfather        appendix   Diabetes Paternal Grandmother    Healthy Daughter    Healthy Son    Glaucoma Maternal Uncle    Breast cancer Neg Hx    Colon cancer Neg Hx    Esophageal cancer Neg Hx    Stomach cancer Neg Hx     Social History   Tobacco Use   Smoking status: Never    Passive exposure: Never   Smokeless tobacco: Never  Vaping Use   Vaping status: Never Used  Substance Use Topics   Alcohol use: Never   Drug  use: No    ROS: As per history of present illness, otherwise negative  BP 94/62   Pulse (!) 58   Ht 5\' 9"  (1.753 m)   Wt 122 lb (55.3 kg)   LMP 07/19/2023 (Approximate)   SpO2 98%   BMI 18.02 kg/m  Gen: awake, alert, NAD HEENT: anicteric  CV: RRR, no mrg Pulm: CTA b/l Abd: soft, thin, NT/ND, +BS throughout Ext: no c/c/e Neuro: nonfocal   RELEVANT LABS AND IMAGING: CBC    Component Value Date/Time   WBC 5.9 12/30/2022 0931   RBC 4.05 12/30/2022 0931   HGB 13.0 12/30/2022 0931   HCT 39.2 12/30/2022 0931   PLT 229 12/30/2022 0931   MCV 96.8 12/30/2022 0931   MCH 32.1 12/30/2022 0931   MCHC 33.2 12/30/2022 0931   RDW 12.2 12/30/2022 0931   LYMPHSABS 1.4 12/30/2022 0931   MONOABS 0.3 12/30/2022 0931   EOSABS 0.1 12/30/2022 0931   BASOSABS 0.0 12/30/2022 0931    CMP     Component Value Date/Time   NA 141 12/30/2022 0931   K 4.0 12/30/2022 0931   CL 107 12/30/2022 0931   CO2 25 12/30/2022 0931   GLUCOSE 88 12/30/2022 0931   BUN 8 12/30/2022 0931   CREATININE 0.66 12/30/2022 0931   CALCIUM 10.0 12/30/2022 0931   PROT 7.3 12/30/2022 0931   ALBUMIN 4.6 12/30/2022 0931   AST 24 12/30/2022 0931   ALT 36 12/30/2022 0931   ALKPHOS 38 12/30/2022 0931   BILITOT 0.8 12/30/2022 0931   GFRNONAA >60 12/30/2022 0931   GFRAA >60 05/09/2015 1029    ASSESSMENT/PLAN: 47 year old female with a history of celiac disease, hyperlipidemia, breast implant illness status post breast implant removal who is seen in consult at the request of Dr. Audrie Lia to establish care for her celiac disease.   Celiac disease --she is well versed in celiac disease and has been following a gluten-free diet.  She follows this diet strictly.  We discussed the natural history of celiac disease and we will assess disease activity by TTG but also check vitamin testing today. -- TTG and IgA -- B12, folate, vitamin D3, thiamine and niacin level -- Continue gluten-free diet strictly  2.  Colon cancer  screening --normal colonoscopy at [facility] in July 2022 -- Repeat screening colonoscopy recommended in July 2032  She can be seen in the office every 1 to 2 years, sooner if needed    EP:PIRJJO, Lysle Morales, Md [address] [place],  [place] [address]

## 2023-07-27 LAB — VITAMIN B12: Vitamin B-12: 1069 pg/mL — ABNORMAL HIGH (ref 211–911)

## 2023-07-27 LAB — FOLATE: Folate: 8.8 ng/mL (ref >5.9)

## 2023-07-27 LAB — VITAMIN D 25 HYDROXY (VIT D DEFICIENCY, FRACTURES): VITD: 32.11 ng/mL (ref 30.00–100.00)

## 2023-07-31 LAB — VITAMIN B1: Vitamin B1 (Thiamine): 34 nmol/L — ABNORMAL HIGH (ref 8–30)

## 2023-07-31 LAB — IGA: Immunoglobulin A: 346 mg/dL — ABNORMAL HIGH (ref 47–310)

## 2023-07-31 LAB — VITAMIN B3
Nicotinamide: <20 ng/mL
Nicotinic Acid: <20 ng/mL

## 2023-07-31 LAB — TISSUE TRANSGLUTAMINASE, IGA: (tTG) Ab, IgA: 3 U/mL

## 2023-08-16 ENCOUNTER — Other Ambulatory Visit (HOSPITAL_BASED_OUTPATIENT_CLINIC_OR_DEPARTMENT_OTHER): Payer: Self-pay

## 2023-08-22 ENCOUNTER — Other Ambulatory Visit (HOSPITAL_BASED_OUTPATIENT_CLINIC_OR_DEPARTMENT_OTHER): Payer: Self-pay

## 2023-09-08 ENCOUNTER — Encounter: Payer: Self-pay | Admitting: Internal Medicine

## 2023-09-09 ENCOUNTER — Other Ambulatory Visit (HOSPITAL_BASED_OUTPATIENT_CLINIC_OR_DEPARTMENT_OTHER): Payer: Self-pay

## 2023-09-09 ENCOUNTER — Other Ambulatory Visit: Payer: Self-pay

## 2023-09-09 MED ORDER — HYDROXYZINE HCL 25 MG PO TABS
25.0000 mg | ORAL_TABLET | Freq: Every day | ORAL | 0 refills | Status: AC | PRN
  Filled 2023-09-09: qty 90, 90d supply, fill #0

## 2023-09-19 ENCOUNTER — Other Ambulatory Visit (HOSPITAL_BASED_OUTPATIENT_CLINIC_OR_DEPARTMENT_OTHER): Payer: Self-pay

## 2023-09-20 ENCOUNTER — Other Ambulatory Visit (HOSPITAL_BASED_OUTPATIENT_CLINIC_OR_DEPARTMENT_OTHER): Payer: Self-pay

## 2023-11-14 ENCOUNTER — Other Ambulatory Visit (HOSPITAL_BASED_OUTPATIENT_CLINIC_OR_DEPARTMENT_OTHER): Payer: Self-pay

## 2023-11-15 ENCOUNTER — Other Ambulatory Visit (HOSPITAL_BASED_OUTPATIENT_CLINIC_OR_DEPARTMENT_OTHER): Payer: Self-pay

## 2023-12-12 ENCOUNTER — Other Ambulatory Visit (HOSPITAL_BASED_OUTPATIENT_CLINIC_OR_DEPARTMENT_OTHER): Payer: Self-pay

## 2023-12-12 MED ORDER — MOUNJARO 2.5 MG/0.5ML ~~LOC~~ SOAJ
2.5000 mg | SUBCUTANEOUS | 2 refills | Status: DC
  Filled 2023-12-12: qty 2, 28d supply, fill #0

## 2023-12-30 ENCOUNTER — Other Ambulatory Visit (HOSPITAL_BASED_OUTPATIENT_CLINIC_OR_DEPARTMENT_OTHER): Payer: Self-pay

## 2023-12-30 MED ORDER — MOUNJARO 5 MG/0.5ML ~~LOC~~ SOAJ
5.0000 mg | SUBCUTANEOUS | 1 refills | Status: DC
  Filled 2023-12-30: qty 2, 28d supply, fill #0
  Filled 2024-02-04 (×2): qty 2, 28d supply, fill #1

## 2024-02-04 ENCOUNTER — Other Ambulatory Visit (HOSPITAL_BASED_OUTPATIENT_CLINIC_OR_DEPARTMENT_OTHER): Payer: Self-pay

## 2024-02-06 ENCOUNTER — Other Ambulatory Visit (HOSPITAL_BASED_OUTPATIENT_CLINIC_OR_DEPARTMENT_OTHER): Payer: Self-pay

## 2024-02-07 ENCOUNTER — Other Ambulatory Visit (HOSPITAL_BASED_OUTPATIENT_CLINIC_OR_DEPARTMENT_OTHER): Payer: Self-pay

## 2024-02-07 MED ORDER — HYDROXYZINE HCL 25 MG PO TABS
25.0000 mg | ORAL_TABLET | Freq: Every day | ORAL | 0 refills | Status: AC | PRN
  Filled 2024-02-07: qty 90, 90d supply, fill #0

## 2024-02-08 ENCOUNTER — Other Ambulatory Visit: Payer: Self-pay | Admitting: Family Medicine

## 2024-02-08 DIAGNOSIS — Z1231 Encounter for screening mammogram for malignant neoplasm of breast: Secondary | ICD-10-CM

## 2024-02-13 ENCOUNTER — Encounter: Payer: Self-pay | Admitting: Family Medicine

## 2024-02-14 ENCOUNTER — Ambulatory Visit (HOSPITAL_BASED_OUTPATIENT_CLINIC_OR_DEPARTMENT_OTHER)
Admission: RE | Admit: 2024-02-14 | Discharge: 2024-02-14 | Disposition: A | Discharge status: Home / Self Care | Source: Ambulatory Visit

## 2024-02-14 ENCOUNTER — Encounter (HOSPITAL_BASED_OUTPATIENT_CLINIC_OR_DEPARTMENT_OTHER): Payer: Self-pay | Admitting: Radiology

## 2024-02-14 DIAGNOSIS — Z1231 Encounter for screening mammogram for malignant neoplasm of breast: Secondary | ICD-10-CM | POA: Insufficient documentation

## 2024-02-17 ENCOUNTER — Ambulatory Visit: Payer: Self-pay | Admitting: Family Medicine

## 2024-03-07 ENCOUNTER — Other Ambulatory Visit (HOSPITAL_BASED_OUTPATIENT_CLINIC_OR_DEPARTMENT_OTHER): Payer: Self-pay

## 2024-03-07 MED ORDER — MOUNJARO 5 MG/0.5ML ~~LOC~~ SOAJ
5.0000 mg | SUBCUTANEOUS | 1 refills | Status: AC
  Filled 2024-03-07: qty 2, 28d supply, fill #0
  Filled 2024-04-09 – 2024-05-17 (×2): qty 2, 28d supply, fill #1

## 2024-04-09 ENCOUNTER — Other Ambulatory Visit (HOSPITAL_BASED_OUTPATIENT_CLINIC_OR_DEPARTMENT_OTHER): Payer: Self-pay

## 2024-04-10 ENCOUNTER — Encounter: Payer: Self-pay | Admitting: Family Medicine

## 2024-04-10 ENCOUNTER — Other Ambulatory Visit (HOSPITAL_COMMUNITY)
Admission: RE | Admit: 2024-04-10 | Discharge: 2024-04-10 | Disposition: A | Discharge status: Home / Self Care | Source: Ambulatory Visit | Attending: Family Medicine | Admitting: Family Medicine

## 2024-04-10 ENCOUNTER — Ambulatory Visit: Admitting: Family Medicine

## 2024-04-10 VITALS — BP 104/67 | HR 63 | Ht 69.0 in | Wt 127.0 lb

## 2024-04-10 DIAGNOSIS — N951 Menopausal and female climacteric states: Secondary | ICD-10-CM | POA: Diagnosis not present

## 2024-04-10 DIAGNOSIS — Z01419 Encounter for gynecological examination (general) (routine) without abnormal findings: Secondary | ICD-10-CM | POA: Diagnosis not present

## 2024-04-10 DIAGNOSIS — L900 Lichen sclerosus et atrophicus: Secondary | ICD-10-CM | POA: Diagnosis not present

## 2024-04-10 DIAGNOSIS — Z124 Encounter for screening for malignant neoplasm of cervix: Secondary | ICD-10-CM | POA: Insufficient documentation

## 2024-04-10 DIAGNOSIS — Z1331 Encounter for screening for depression: Secondary | ICD-10-CM | POA: Diagnosis not present

## 2024-04-10 MED ORDER — NORETHIN-ETH ESTRAD-FE BIPHAS 1 MG-10 MCG / 10 MCG PO TABS: 1.0000 | ORAL_TABLET | Freq: Every day | ORAL | 3 refills | Status: AC

## 2024-04-10 MED ORDER — CLOBETASOL PROPIONATE E 0.05 % EX CREA: 1.0000 | TOPICAL_CREAM | CUTANEOUS | 5 refills | Status: DC

## 2024-04-10 NOTE — Progress Notes (Signed)
 Patient presents for Annual.  LMP: 03/16/24 Last pap: Date: 2024-WNL  Contraception: Perimenopausal  Mammogram: Up to date: 02/14/24 Colonoscopy: 10/13/2015 STD Screening: Declines Flu Vaccine : N/A  CC: Perimenopause, low libido, hair loss, burning sensation on sex,  pain w/ intercourse, vaginal dryness, not sleeping well, occasional night sweats.  *Discuss Bone density following xray done by chiropractor.

## 2024-04-10 NOTE — Progress Notes (Signed)
 Subjective:     Amber Mann is a 48 y.o. female and is here for a comprehensive physical exam. The patient reports problems - menopausal symptoms. Skipping some cycles now. Sometimes having 2x/month. Having hair loss. Decreased libido. Sex is painful.  Having some heat. Waking up at night 3-4x/week. Reports discomfort with intercourse and some vaginal itching.  The following portions of the patient's history were reviewed and updated as appropriate: allergies, current medications, past family history, past medical history, past social history, past surgical history, and problem list.  Review of Systems Pertinent items noted in HPI and remainder of comprehensive ROS otherwise negative.   Objective:  Chaperone present for exam   BP 104/67   Pulse 63   Ht 5' 9 (1.753 m)   Wt 127 lb (57.6 kg)   BMI 18.75 kg/m  General appearance: alert, cooperative, and appears stated age Head: Normocephalic, without obvious abnormality, atraumatic Neck: no adenopathy, supple, symmetrical, trachea midline, and thyroid not enlarged, symmetric, no tenderness/mass/nodules Lungs: clear to auscultation bilaterally Breasts: normal appearance, no masses or tenderness, scars from reduction surgery Heart: regular rate and rhythm, S1, S2 normal, no murmur, click, rub or gallop Abdomen: soft, non-tender; bowel sounds normal; no masses,  no organomegaly Pelvic: cervix normal in appearance, no adnexal masses or tenderness, no cervical motion tenderness, uterus normal size, shape, and consistency, vagina normal without discharge, and labia minora with some hypopigmentation and white plaque Extremities: extremities normal, atraumatic, no cyanosis or edema Pulses: 2+ and symmetric Skin: Skin color, texture, turgor normal. No rashes or lesions Lymph nodes: Cervical, supraclavicular, and axillary nodes normal. Neurologic: Grossly normal    Assessment:    Healthy female exam.      Plan:   Problem List Items  Addressed This Visit       Unprioritized   Perimenopausal   00785 - still with regular cycles. Given this, do not think that she is a great candidate for HRT. Will try ultra low dose COC's with 10 mcg OC. Hopefully, this will help balance out things. No real risk factors for estrogen. Father with protein C def, but she has tested negative. Advised weight bearing exercise.      Relevant Medications   Norethindrone-Ethinyl Estradiol-Fe Biphas (LO LOESTRIN FE) 1 MG-10 MCG / 10 MCG tablet   Lichen sclerosus   99214 - suspected. Will trial high potency steroid topically. Discussed lifelong need for treatment and surveillance and risk of vulvar cancer. Treatment is typically with high dose steroid and should include 2x/day x 6 weeks, 1x/day x 6 weeks, qod x 6 weeks, then 2x/week. F/u in office for vulvar exam with photos in 3 months. Then q 6 month surveillance.       Relevant Medications   Clobetasol  Prop Emollient Base (CLOBETASOL  PROPIONATE E) 0.05 % emollient cream   Other Visit Diagnoses       Encounter for gynecological examination without abnormal finding    -  Primary   Mammogram is up to date.     Screening for cervical cancer       Relevant Orders   Cytology - PAP      Return in 3 months (on 07/11/2024) for a follow-up.    See After Visit Summary for Counseling Recommendations

## 2024-04-10 NOTE — Assessment & Plan Note (Signed)
 00785 - suspected. Will trial high potency steroid topically. Discussed lifelong need for treatment and surveillance and risk of vulvar cancer. Treatment is typically with high dose steroid and should include 2x/day x 6 weeks, 1x/day x 6 weeks, qod x 6 weeks, then 2x/week. F/u in office for vulvar exam with photos in 3 months. Then q 6 month surveillance.

## 2024-04-10 NOTE — Patient Instructions (Signed)
Uberlube

## 2024-04-10 NOTE — Assessment & Plan Note (Signed)
 00785 - still with regular cycles. Given this, do not think that she is a great candidate for HRT. Will try ultra low dose COC's with 10 mcg OC. Hopefully, this will help balance out things. No real risk factors for estrogen. Father with protein C def, but she has tested negative. Advised weight bearing exercise.

## 2024-04-12 ENCOUNTER — Ambulatory Visit: Admitting: Family Medicine

## 2024-04-16 ENCOUNTER — Encounter: Payer: Self-pay | Admitting: Family Medicine

## 2024-04-16 LAB — CYTOLOGY - PAP
Adequacy: ABSENT
Comment: NEGATIVE
Diagnosis: NEGATIVE
High risk HPV: NEGATIVE

## 2024-04-17 ENCOUNTER — Ambulatory Visit: Payer: Self-pay | Admitting: Family Medicine

## 2024-04-18 ENCOUNTER — Other Ambulatory Visit (HOSPITAL_BASED_OUTPATIENT_CLINIC_OR_DEPARTMENT_OTHER): Payer: Self-pay

## 2024-04-18 DIAGNOSIS — L900 Lichen sclerosus et atrophicus: Secondary | ICD-10-CM

## 2024-04-18 MED ORDER — CLOBETASOL PROPIONATE E 0.05 % EX CREA
1.0000 | TOPICAL_CREAM | Freq: Every day | CUTANEOUS | 5 refills | Status: DC
Start: 2024-04-18 — End: 2024-05-02
  Filled 2024-04-18 – 2024-05-02 (×3): qty 30, 60d supply, fill #0
  Filled ????-??-?? (×2): fill #0

## 2024-04-20 ENCOUNTER — Other Ambulatory Visit (HOSPITAL_BASED_OUTPATIENT_CLINIC_OR_DEPARTMENT_OTHER): Payer: Self-pay

## 2024-05-02 ENCOUNTER — Other Ambulatory Visit (HOSPITAL_BASED_OUTPATIENT_CLINIC_OR_DEPARTMENT_OTHER): Payer: Self-pay

## 2024-05-02 MED ORDER — CLOBETASOL PROPIONATE 0.05 % EX OINT
1.0000 | TOPICAL_OINTMENT | Freq: Two times a day (BID) | CUTANEOUS | 0 refills | Status: AC
Start: 2024-05-02 — End: ?
  Filled 2024-05-02: qty 15, 15d supply, fill #0
  Filled 2024-05-02: qty 30, 15d supply, fill #0
  Filled 2024-05-02 (×3): qty 15, 15d supply, fill #0
  Filled 2024-05-02: qty 30, 15d supply, fill #0
  Filled 2024-05-19: qty 15, 8d supply, fill #1

## 2024-05-03 ENCOUNTER — Other Ambulatory Visit: Payer: Self-pay

## 2024-05-03 ENCOUNTER — Other Ambulatory Visit (HOSPITAL_BASED_OUTPATIENT_CLINIC_OR_DEPARTMENT_OTHER): Payer: Self-pay

## 2024-05-14 ENCOUNTER — Other Ambulatory Visit (HOSPITAL_BASED_OUTPATIENT_CLINIC_OR_DEPARTMENT_OTHER): Payer: Self-pay

## 2024-05-15 ENCOUNTER — Ambulatory Visit (INDEPENDENT_AMBULATORY_CARE_PROVIDER_SITE_OTHER): Admitting: Family Medicine

## 2024-05-15 ENCOUNTER — Encounter: Payer: Self-pay | Admitting: Family Medicine

## 2024-05-15 VITALS — BP 109/72 | HR 73 | Ht 69.0 in | Wt 124.4 lb

## 2024-05-15 DIAGNOSIS — L900 Lichen sclerosus et atrophicus: Secondary | ICD-10-CM | POA: Diagnosis not present

## 2024-05-15 NOTE — Progress Notes (Unsigned)
   Subjective:    Patient ID: Amber Mann is a 48 y.o. female presenting with Follow-up  on 05/15/2024  HPI: Since she last visit, where we thought she might have LS, she has been on topical steroids. We have tried a cream and ointment. She has some irritation and rawness. It is better with the ointment.   Review of Systems  Constitutional:  Negative for chills and fever.  Respiratory:  Negative for shortness of breath.   Cardiovascular:  Negative for chest pain.  Gastrointestinal:  Negative for abdominal pain, nausea and vomiting.  Genitourinary:  Negative for dysuria.  Skin:  Negative for rash.      Objective:    BP 109/72   Pulse 73   Ht 5' 9 (1.753 m)   Wt 124 lb 6.4 oz (56.4 kg)   BMI 18.37 kg/m  Physical Exam Exam conducted with a chaperone present.  Constitutional:      General: She is not in acute distress.    Appearance: She is well-developed.  HENT:     Head: Normocephalic and atraumatic.  Eyes:     General: No scleral icterus. Cardiovascular:     Rate and Rhythm: Normal rate.  Pulmonary:     Effort: Pulmonary effort is normal.  Abdominal:     Palpations: Abdomen is soft.  Genitourinary:    Comments: There continues to be some color change and some loss of architecture around the labia minora see photo Musculoskeletal:     Cervical back: Neck supple.  Skin:    General: Skin is warm and dry.  Neurological:     Mental Status: She is alert and oriented to person, place, and time.         Assessment & Plan:   Problem List Items Addressed This Visit       Unprioritized   Lichen sclerosus - Primary   Possible. We have not biopsied anything. Given her symptoms with the meds, will hold off on these for now, and see if things progress.        Return in about 6 months (around 11/15/2024) for a follow-up.  Glenys GORMAN Birk, MD 05/15/2024 4:16 PM

## 2024-05-16 NOTE — Assessment & Plan Note (Signed)
 Possible. We have not biopsied anything. Given her symptoms with the meds, will hold off on these for now, and see if things progress.

## 2024-05-17 ENCOUNTER — Other Ambulatory Visit (HOSPITAL_BASED_OUTPATIENT_CLINIC_OR_DEPARTMENT_OTHER): Payer: Self-pay

## 2024-05-17 MED ORDER — HYDROXYZINE HCL 25 MG PO TABS
25.0000 mg | ORAL_TABLET | Freq: Every day | ORAL | 0 refills | Status: AC | PRN
  Filled 2024-05-17: qty 90, 90d supply, fill #0

## 2024-05-19 ENCOUNTER — Other Ambulatory Visit (HOSPITAL_BASED_OUTPATIENT_CLINIC_OR_DEPARTMENT_OTHER): Payer: Self-pay

## 2024-05-22 ENCOUNTER — Other Ambulatory Visit: Payer: Self-pay

## 2024-06-01 ENCOUNTER — Other Ambulatory Visit (HOSPITAL_BASED_OUTPATIENT_CLINIC_OR_DEPARTMENT_OTHER): Payer: Self-pay

## 2024-06-15 ENCOUNTER — Other Ambulatory Visit (HOSPITAL_BASED_OUTPATIENT_CLINIC_OR_DEPARTMENT_OTHER): Payer: Self-pay

## 2024-06-15 MED ORDER — MOUNJARO 7.5 MG/0.5ML ~~LOC~~ SOAJ
7.5000 mg | SUBCUTANEOUS | 2 refills | Status: AC
  Filled 2024-06-15: qty 2, 28d supply, fill #0
  Filled 2024-07-19: qty 2, 28d supply, fill #1
  Filled 2024-08-12: qty 2, 28d supply, fill #2
  Filled 2024-09-15: qty 2, 28d supply, fill #3
  Filled 2024-10-19: qty 2, 28d supply, fill #4

## 2024-07-19 ENCOUNTER — Other Ambulatory Visit (HOSPITAL_BASED_OUTPATIENT_CLINIC_OR_DEPARTMENT_OTHER): Payer: Self-pay

## 2024-08-13 ENCOUNTER — Other Ambulatory Visit (HOSPITAL_BASED_OUTPATIENT_CLINIC_OR_DEPARTMENT_OTHER): Payer: Self-pay

## 2024-09-15 ENCOUNTER — Other Ambulatory Visit (HOSPITAL_BASED_OUTPATIENT_CLINIC_OR_DEPARTMENT_OTHER): Payer: Self-pay

## 2024-09-18 ENCOUNTER — Other Ambulatory Visit (HOSPITAL_BASED_OUTPATIENT_CLINIC_OR_DEPARTMENT_OTHER): Payer: Self-pay

## 2024-09-18 MED ORDER — MOUNJARO 7.5 MG/0.5ML ~~LOC~~ SOAJ
7.5000 mg | SUBCUTANEOUS | 2 refills | Status: AC
  Filled 2024-09-18: qty 2, 28d supply, fill #0

## 2024-09-19 ENCOUNTER — Other Ambulatory Visit (HOSPITAL_BASED_OUTPATIENT_CLINIC_OR_DEPARTMENT_OTHER): Payer: Self-pay

## 2024-10-19 ENCOUNTER — Other Ambulatory Visit: Payer: Self-pay

## 2024-10-19 ENCOUNTER — Other Ambulatory Visit (HOSPITAL_BASED_OUTPATIENT_CLINIC_OR_DEPARTMENT_OTHER): Payer: Self-pay
